# Patient Record
Sex: Female | Born: 1950 | Race: White | Hispanic: No | Marital: Married | Smoking: Never smoker
Health system: Southern US, Community
[De-identification: ages and names within clinical notes are randomized; demographics above are authoritative.]

## PROBLEM LIST (undated history)

## (undated) DIAGNOSIS — E785 Hyperlipidemia, unspecified: Secondary | ICD-10-CM

## (undated) DIAGNOSIS — K219 Gastro-esophageal reflux disease without esophagitis: Secondary | ICD-10-CM

## (undated) DIAGNOSIS — R112 Nausea with vomiting, unspecified: Secondary | ICD-10-CM

## (undated) DIAGNOSIS — Z9889 Other specified postprocedural states: Secondary | ICD-10-CM

## (undated) DIAGNOSIS — L121 Cicatricial pemphigoid: Secondary | ICD-10-CM

## (undated) HISTORY — DX: Cicatricial pemphigoid: L12.1

## (undated) HISTORY — PX: CHOLECYSTECTOMY: SHX55

## (undated) HISTORY — PX: APPENDECTOMY: SHX54

## (undated) HISTORY — DX: Gastro-esophageal reflux disease without esophagitis: K21.9

## (undated) HISTORY — DX: Hyperlipidemia, unspecified: E78.5

## (undated) HISTORY — PX: ABDOMINAL HYSTERECTOMY: SHX81

---

## 1998-04-10 ENCOUNTER — Other Ambulatory Visit: Admission: RE | Admit: 1998-04-10 | Discharge: 1998-04-10 | Payer: Self-pay | Admitting: Gynecology

## 1998-11-14 ENCOUNTER — Inpatient Hospital Stay (HOSPITAL_COMMUNITY): Admission: RE | Admit: 1998-11-14 | Discharge: 1998-11-16 | Payer: Self-pay | Admitting: Gynecology

## 1999-01-29 ENCOUNTER — Other Ambulatory Visit: Admission: RE | Admit: 1999-01-29 | Discharge: 1999-01-29 | Payer: Self-pay | Admitting: Gynecology

## 1999-06-01 ENCOUNTER — Encounter: Payer: Self-pay | Admitting: Gynecology

## 1999-06-01 ENCOUNTER — Encounter: Admission: RE | Admit: 1999-06-01 | Discharge: 1999-06-01 | Payer: Self-pay | Admitting: Gynecology

## 2000-02-27 ENCOUNTER — Other Ambulatory Visit: Admission: RE | Admit: 2000-02-27 | Discharge: 2000-02-27 | Payer: Self-pay | Admitting: Gynecology

## 2000-09-18 ENCOUNTER — Encounter: Admission: RE | Admit: 2000-09-18 | Discharge: 2000-09-18 | Payer: Self-pay | Admitting: Gynecology

## 2000-09-18 ENCOUNTER — Encounter: Payer: Self-pay | Admitting: Gynecology

## 2000-12-08 ENCOUNTER — Other Ambulatory Visit: Admission: RE | Admit: 2000-12-08 | Discharge: 2000-12-08 | Payer: Self-pay | Admitting: Dermatology

## 2001-07-09 ENCOUNTER — Other Ambulatory Visit: Admission: RE | Admit: 2001-07-09 | Discharge: 2001-07-09 | Payer: Self-pay | Admitting: Gynecology

## 2001-11-19 ENCOUNTER — Encounter: Admission: RE | Admit: 2001-11-19 | Discharge: 2001-11-19 | Payer: Self-pay | Admitting: Gynecology

## 2001-11-19 ENCOUNTER — Encounter: Payer: Self-pay | Admitting: Gynecology

## 2002-12-20 ENCOUNTER — Encounter: Admission: RE | Admit: 2002-12-20 | Discharge: 2002-12-20 | Payer: Self-pay | Admitting: Gynecology

## 2002-12-20 ENCOUNTER — Encounter: Payer: Self-pay | Admitting: Gynecology

## 2002-12-29 ENCOUNTER — Other Ambulatory Visit: Admission: RE | Admit: 2002-12-29 | Discharge: 2002-12-29 | Payer: Self-pay | Admitting: Gynecology

## 2004-01-03 ENCOUNTER — Other Ambulatory Visit: Admission: RE | Admit: 2004-01-03 | Discharge: 2004-01-03 | Payer: Self-pay | Admitting: Gynecology

## 2004-01-04 ENCOUNTER — Encounter: Admission: RE | Admit: 2004-01-04 | Discharge: 2004-01-04 | Payer: Self-pay | Admitting: Gynecology

## 2005-04-22 ENCOUNTER — Other Ambulatory Visit: Admission: RE | Admit: 2005-04-22 | Discharge: 2005-04-22 | Payer: Self-pay | Admitting: Gynecology

## 2005-04-25 ENCOUNTER — Encounter: Admission: RE | Admit: 2005-04-25 | Discharge: 2005-04-25 | Payer: Self-pay | Admitting: Gynecology

## 2006-07-07 ENCOUNTER — Ambulatory Visit: Payer: Self-pay | Admitting: Internal Medicine

## 2006-07-09 ENCOUNTER — Ambulatory Visit (HOSPITAL_COMMUNITY): Admission: RE | Admit: 2006-07-09 | Discharge: 2006-07-09 | Payer: Self-pay | Admitting: Internal Medicine

## 2006-07-09 ENCOUNTER — Ambulatory Visit: Payer: Self-pay | Admitting: Cardiology

## 2006-07-28 ENCOUNTER — Other Ambulatory Visit: Admission: RE | Admit: 2006-07-28 | Discharge: 2006-07-28 | Payer: Self-pay | Admitting: Gynecology

## 2006-08-05 ENCOUNTER — Encounter: Admission: RE | Admit: 2006-08-05 | Discharge: 2006-08-05 | Payer: Self-pay | Admitting: Gynecology

## 2006-08-22 ENCOUNTER — Ambulatory Visit: Payer: Self-pay | Admitting: Internal Medicine

## 2007-08-07 ENCOUNTER — Encounter: Admission: RE | Admit: 2007-08-07 | Discharge: 2007-08-07 | Payer: Self-pay | Admitting: Gynecology

## 2009-02-08 ENCOUNTER — Encounter: Admission: RE | Admit: 2009-02-08 | Discharge: 2009-02-08 | Payer: Self-pay | Admitting: Gynecology

## 2009-06-28 ENCOUNTER — Encounter: Admission: RE | Admit: 2009-06-28 | Discharge: 2009-06-28 | Payer: Self-pay | Admitting: Gynecology

## 2010-05-16 ENCOUNTER — Encounter: Admission: RE | Admit: 2010-05-16 | Discharge: 2010-05-16 | Payer: Self-pay | Admitting: Gynecology

## 2010-05-29 ENCOUNTER — Encounter: Admission: RE | Admit: 2010-05-29 | Discharge: 2010-05-29 | Payer: Self-pay | Admitting: Gynecology

## 2010-11-23 NOTE — Procedures (Signed)
NAMERAYNETTE, ARRAS NO.:  [id number]   MEDICAL RECORD NO.:  [id number]          PATIENT TYPE:  OUT   LOCATION:  RAD                           FACILITY:  [facility]   PHYSICIAN:  Gerrit Friends. Dietrich Pates, MD, FACCDATE OF BIRTH:  Feb 21, 1951   DATE OF PROCEDURE:  07/09/2006  DATE OF DISCHARGE:                                ECHOCARDIOGRAM   REFERRING PHYSICIAN:  Dr. Jodelle Green and Dr. Tenny Craw   CLINICAL DATA:  60 year old woman with palpitations.  Aorta 3.1, left  atrium 3.5, septum 1.2, posterior wall 0.9, LV diastole 3.5, LV systole  2.7.   1. Technically adequate echocardiographic study.  2. Normal left atrium, right atrium and right ventricle.  3. Normal trileaflet aortic valve; normal proximal ascending aorta.  4. Normal tricuspid and pulmonic valve; normal proximal pulmonary      artery.  5. Mild mitral valve thickening; trivial mitral regurgitation.  6. Normal internal dimension, wall thickness, regional and global      function of the left ventricle.  7. Normal IVC.      Gerrit Friends. Dietrich Pates, MD, [facility]  Electronically Signed     RMR/MEDQ  D:  07/09/2006  T:  07/09/2006  Job:  [id number]

## 2010-11-23 NOTE — Assessment & Plan Note (Signed)
[place] [facility]                       [place] CARDIOLOGY OFFICE NOTE   Sarissa, Dern MCKYNLIE VANDERSLICE                   MRN:          [id number]  DATE:07/07/2006                            DOB:          1950/10/24    IDENTIFICATION:  Ms. Peggy Hines is a 60 year old woman who is referred for  palpitations.  The patient notes that beginning in June, she began  having episodes of palpitation, initially short-lived but recently  coming on more frequently, lasting several minutes to a half hour at a  time.  She says she feels them more at night when she is lying down.  No  dizziness.  She cut back on caffeine but really noticed no change.   She had a couple of episodes right before Christmas, one in [employer]  when she was walking quickly, one at school where she had some  indigestion-like feeling, eased off when she sat down.  She has not had  any since, even though she has pushed herself to more higher exertion.   ALLERGIES:  None.   MEDICATIONS:  1. Estrogen patch.  2. Calcium with D.  3. Multivitamin.  4. Vitamin E 400.  5. Aspirin 325 daily.  6. Vitamin B-12.   PAST MEDICAL HISTORY:  Negative.   PAST SURGICAL HISTORY:  Status post hysterectomy, status post  appendectomy, status post cholecystectomy.   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   FAMILY HISTORY:  Father had a heart attack at age 65, died at 1.  Mother had CVAs, felt secondary to atrial fibrillation and died at age  19.  Atrial fibrillation began at age 1.  The patient has 3 brothers  who are alive, 1 with atrial fibrillation, 2 sisters who are alive, 1  with atrial fibrillation.   REVIEW OF SYSTEMS:  All systems reviewed, negative except as noted.   Note, TSH was checked and it was normal at 1.1 in June.  Total  cholesterol 215, triglycerides 145, HDL of 47, LDL of 139.  The patient  eats an egg for breakfast.  Dinner will be  all over the place with  foods.   PHYSICAL EXAM:  The patient is in no distress.  Blood pressure 140/90, right arm; 130/88, left arm.  Pulse 84 and  regular.  Weight 166.  NECK:  JVP is normal.  No thyromegaly.  No bruits.  LUNGS:  Clear.  CARDIAC:  Regular rate and rhythm.  S1, S2.  No S3, S4, or murmurs.  ABDOMEN:  Benign.  EXTREMITIES:  Good distal pulses throughout. No lower extremity edema.   A 12-lead EKG, sinus rhythm, 84 beats per minute, poor R-wave  progression in mid anterior leads.   IMPRESSION:  Ms. Claytor is a 60 year old woman with a history of  palpitations for the past 6 months. I would like to set her up for an  event monitor to document what they are.  She does have a significant  family history for atrial fibrillation.  Also, this would interesting if  she had this.   For now, I would keep her  on an aspirin 325.   I would also set her up for an echocardiogram, especially with her EKG  findings that may be lead placement.   I have discussed her lipids with her.  I think she has room to improve.  I would like to get her LDL towards 100.  I would try dietary, exercise,  and weight loss for this.  Followup in 9 months' time.   I set up to see the patient back in 1 month's time.     Pricilla Riffle, MD, [facility]  Electronically Signed    PVR/MedQ  DD: 07/07/2006  DT: 07/07/2006  Job #: [id number]   cc:   Patrica Duel, M.D.

## 2010-11-23 NOTE — Assessment & Plan Note (Signed)
[facility]                            CARDIOLOGY OFFICE NOTE   Peggy Hines, Peggy Hines Peggy Hines                   MRN:          [id number]  DATE:08/22/2006                            DOB:          [birth year]    IDENTIFICATION:  Ms. Peggy Hines is a 60 year old woman who I saw back on  December 31 for evaluation of palpitations.  I set her up for a Holter  monitor and echo.  The patient had the echo done on January 2.  This  showed minimal LVH of the septum at 12 mm, otherwise, normal.  There was  no significant valvular abnormalities.  LV systolic function was normal.  The patient also wore an event monitor for a month.  She had episodes of  palpitations.  There was only one spell on January 13 where she had a  short burst of PAT for 13 beats that could consider atrial fibrillation,  but again, less than 10 seconds.  Otherwise, sinus rhythm, sinus tach.   On talking to the patient, she actually is doing some better, her stress  load has gone down.  She is not in any discomfort in her chest.  She has  cut back on caffeine.   CURRENT MEDICATIONS:  Estrogen patch, calcium with D, multi-vitamin,  aspirin 325 (taken since 2000).   PHYSICAL EXAMINATION:  GENERAL:  The patient is in no distress.  VITAL SIGNS:  Blood pressure 132/80, pulse 76 and regular, weight 169.  LUNGS:  Clear.  CARDIAC:  Regular rate and rhythm, S1 and S2, no S3, no murmurs.  EXTREMITIES:  No edema.   IMPRESSION:  1. Palpitations, strong family history for atrial fibrillation, short      burst of PAT, she may have other spells but I think with minimal      symptoms, I would keep her on an aspirin, she has tolerated it for      the past seven years.  Follow up in about 14 months.  2. Health care maintenance.  When I saw her last, her LDL was noted to      be 139 back in June, HDL 47, and again, I talked to her about diet      and increasing her exercise.  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  I will follow up again in a little over a years      time.   If her symptoms worsen, of course, I would be happy to see her sooner.     Pricilla Riffle, MD, [facility]  Electronically Signed    PVR/MedQ  DD: 08/22/2006  DT: 08/22/2006  Job #: [id number]   cc:   Peggy Hines, M.D.

## 2011-08-06 ENCOUNTER — Other Ambulatory Visit: Payer: Self-pay | Admitting: Gynecology

## 2011-08-06 DIAGNOSIS — Z1231 Encounter for screening mammogram for malignant neoplasm of breast: Secondary | ICD-10-CM

## 2011-08-14 ENCOUNTER — Ambulatory Visit
Admission: RE | Admit: 2011-08-14 | Discharge: 2011-08-14 | Disposition: A | Discharge status: Home / Self Care | Payer: BC Managed Care – PPO | Source: Ambulatory Visit | Attending: Gynecology | Admitting: Gynecology

## 2011-08-14 ENCOUNTER — Ambulatory Visit: Payer: Self-pay

## 2011-08-14 DIAGNOSIS — Z1231 Encounter for screening mammogram for malignant neoplasm of breast: Secondary | ICD-10-CM

## 2011-08-21 ENCOUNTER — Other Ambulatory Visit: Payer: Self-pay | Admitting: Gynecology

## 2011-08-21 DIAGNOSIS — R928 Other abnormal and inconclusive findings on diagnostic imaging of breast: Secondary | ICD-10-CM

## 2011-09-04 ENCOUNTER — Ambulatory Visit
Admission: RE | Admit: 2011-09-04 | Discharge: 2011-09-04 | Disposition: A | Discharge status: Home / Self Care | Payer: BC Managed Care – PPO | Source: Ambulatory Visit | Attending: Gynecology | Admitting: Gynecology

## 2011-09-04 DIAGNOSIS — R928 Other abnormal and inconclusive findings on diagnostic imaging of breast: Secondary | ICD-10-CM

## 2012-08-25 ENCOUNTER — Other Ambulatory Visit: Payer: Self-pay | Admitting: Gynecology

## 2012-08-25 DIAGNOSIS — Z1231 Encounter for screening mammogram for malignant neoplasm of breast: Secondary | ICD-10-CM

## 2012-09-10 ENCOUNTER — Ambulatory Visit
Admission: RE | Admit: 2012-09-10 | Discharge: 2012-09-10 | Disposition: A | Discharge status: Home / Self Care | Payer: BC Managed Care – PPO | Source: Ambulatory Visit | Attending: Gynecology | Admitting: Gynecology

## 2012-09-10 ENCOUNTER — Ambulatory Visit: Payer: BC Managed Care – PPO

## 2012-09-10 DIAGNOSIS — Z1231 Encounter for screening mammogram for malignant neoplasm of breast: Secondary | ICD-10-CM

## 2013-09-01 ENCOUNTER — Other Ambulatory Visit: Payer: Self-pay

## 2013-09-01 DIAGNOSIS — Z1231 Encounter for screening mammogram for malignant neoplasm of breast: Secondary | ICD-10-CM

## 2013-09-14 ENCOUNTER — Ambulatory Visit
Admission: RE | Admit: 2013-09-14 | Discharge: 2013-09-14 | Disposition: A | Discharge status: Home / Self Care | Payer: BC Managed Care – PPO | Source: Ambulatory Visit

## 2013-09-14 DIAGNOSIS — Z1231 Encounter for screening mammogram for malignant neoplasm of breast: Secondary | ICD-10-CM

## 2013-09-16 ENCOUNTER — Other Ambulatory Visit: Payer: Self-pay | Admitting: Obstetrics and Gynecology

## 2013-09-16 DIAGNOSIS — R928 Other abnormal and inconclusive findings on diagnostic imaging of breast: Secondary | ICD-10-CM

## 2013-10-05 ENCOUNTER — Ambulatory Visit
Admission: RE | Admit: 2013-10-05 | Discharge: 2013-10-05 | Disposition: A | Discharge status: Home / Self Care | Payer: BC Managed Care – PPO | Source: Ambulatory Visit | Attending: Obstetrics and Gynecology | Admitting: Obstetrics and Gynecology

## 2013-10-05 DIAGNOSIS — R928 Other abnormal and inconclusive findings on diagnostic imaging of breast: Secondary | ICD-10-CM

## 2014-10-06 ENCOUNTER — Other Ambulatory Visit: Payer: Self-pay

## 2014-10-06 DIAGNOSIS — Z1231 Encounter for screening mammogram for malignant neoplasm of breast: Secondary | ICD-10-CM

## 2014-10-07 ENCOUNTER — Ambulatory Visit
Admission: RE | Admit: 2014-10-07 | Discharge: 2014-10-07 | Disposition: A | Discharge status: Home / Self Care | Payer: BC Managed Care – PPO | Source: Ambulatory Visit

## 2014-10-07 DIAGNOSIS — Z1231 Encounter for screening mammogram for malignant neoplasm of breast: Secondary | ICD-10-CM

## 2015-07-09 HISTORY — PX: SPINAL FUSION: SHX223

## 2015-09-13 ENCOUNTER — Other Ambulatory Visit: Payer: Self-pay

## 2015-09-13 DIAGNOSIS — Z1231 Encounter for screening mammogram for malignant neoplasm of breast: Secondary | ICD-10-CM

## 2015-10-12 ENCOUNTER — Ambulatory Visit
Admission: RE | Admit: 2015-10-12 | Discharge: 2015-10-12 | Disposition: A | Discharge status: Home / Self Care | Payer: Medicare Other | Source: Ambulatory Visit

## 2015-10-12 DIAGNOSIS — Z1231 Encounter for screening mammogram for malignant neoplasm of breast: Secondary | ICD-10-CM

## 2015-10-24 ENCOUNTER — Encounter: Payer: Self-pay | Admitting: Family Medicine

## 2015-10-24 ENCOUNTER — Other Ambulatory Visit: Payer: Self-pay | Admitting: Family Medicine

## 2015-10-24 ENCOUNTER — Ambulatory Visit (INDEPENDENT_AMBULATORY_CARE_PROVIDER_SITE_OTHER): Payer: Medicare Other | Admitting: Family Medicine

## 2015-10-24 ENCOUNTER — Ambulatory Visit
Admission: RE | Admit: 2015-10-24 | Discharge: 2015-10-24 | Disposition: A | Discharge status: Home / Self Care | Payer: Medicare Other | Source: Ambulatory Visit | Attending: Family Medicine | Admitting: Family Medicine

## 2015-10-24 VITALS — BP 146/84 | Ht 68.0 in | Wt 165.0 lb

## 2015-10-24 DIAGNOSIS — M542 Cervicalgia: Secondary | ICD-10-CM

## 2015-10-24 DIAGNOSIS — M79602 Pain in left arm: Secondary | ICD-10-CM | POA: Diagnosis not present

## 2015-10-24 MED ORDER — MELOXICAM 7.5 MG PO TABS: ORAL_TABLET | ORAL | Status: DC

## 2015-10-24 NOTE — Progress Notes (Signed)
Peggy Hines - 65 y.o. female MRN [id number]  Date of birth: 10/30/1950  CC: Left sided neck and arm pain  SUBJECTIVE:   HPI Peggy Hines is a very pleasant 65 year old female presenting with left-sided neck and arm discomfort. This pain began in early March when she thought she pulled a muscle in her mid trap on the left.  She initially treated herself with Aleve and heating pad but the pain worsened. She saw her PCP who prescribed prednisone and I am steroid shot. She is unsure if this really helped although over the next few weeks her pain is continuing to progress. She is seeing a chiropractor 7 times with no improvement. She then returned her PCP 5 days ago on 10/19/2015 and was prescribed gabapentin 100 mg daily at bedtime as well as what sounds like a Toradol and muscle relaxer shot. The gabapentin is providing some relief. Over the last few weeks her arm pain has worsened and her neck pain has failed to improve. She does not have any history of neck pain in the past. She states that she has intermittent numbness in her shoulder or elbow and most prominently on the first and second digit of her left hand. She does feel shooting pain from her neck down her arm. She denies any weakness. She does have numbness. She has no loss of dexterity and she has not been dropping items. She states that she has full range of motion of her entire left upper extremity. She has currently minimal neck pain with full range of motion. She denies any bowel or bladder dysfunction or lower extremity symptoms. She has not been taking any NSAIDs in the last few weeks.  ROS:     14 point review of systems negative other than that listed above in regards to musculoskeletal issue  HISTORY: Past Medical, Surgical, Social, and Family History Reviewed & Updated per EMR.  Pertinent Historical Findings include: Hyperlipidemia, otherwise not on any chronic medications.  OBJECTIVE: BP 146/84 mmHg  Ht 5\' 8"  (1.727 m)  Wt  165 lb (74.844 kg)  BMI 25.09 kg/m2  Physical Exam Calm, no acute distress Nonlabored breathing  Neck: Positive spurling's on the left. Negative Hoffmann's reflex. Full neck range of motion without discomfort Grip strength and sensation normal in bilateral hands Strength good C4 to T1 distribution other than 4+ out of 5 elbow extension No sensory change to C4 to T1 Reflexes normal  MEDICATIONS, LABS & OTHER ORDERS: Previous Medications   ESTRADIOL (VIVELLE-DOT) 0.05 MG/24HR PATCH    APPLY 1 PATCH TWICE A WEEK   FUROSEMIDE (LASIX) 20 MG TABLET       GABAPENTIN (NEURONTIN) 100 MG CAPSULE       SIMVASTATIN (ZOCOR) 20 MG TABLET       Modified Medications   No medications on file   New Prescriptions   MELOXICAM (MOBIC) 7.5 MG TABLET    Take one tablet a day for 10 days, then take as needed for pain.   Discontinued Medications   No medications on file   Orders Placed This Encounter  Procedures  . MR Cervical Spine Wo Contrast   ASSESSMENT & PLAN: Cervical radiculopathy, left-sided: Peggy Hines has been dealing with this discomfort for the last month. It is progressive in nature and appears to be leading to some upper extremity weakness. I think we need to get a 4 view cervical spine x-ray (along with flexion/extension views) and subsequently an MRI to evaluate for an acute disc rupture. In  the interim she will gradually increase her gabapentin dose, no more than one additional 100 mg tab every 3 days. Additionally she will start taking Mobic 7.5 mg daily for the next 10 days and then as needed. We will call her with her x-ray and MRI results. Should she have any progressive symptoms she needs to seek immediate medical attention. Call with any questions in the interim.

## 2015-10-31 ENCOUNTER — Ambulatory Visit
Admission: RE | Admit: 2015-10-31 | Discharge: 2015-10-31 | Disposition: A | Discharge status: Home / Self Care | Payer: Medicare Other | Source: Ambulatory Visit | Attending: Family Medicine | Admitting: Family Medicine

## 2015-10-31 DIAGNOSIS — M542 Cervicalgia: Secondary | ICD-10-CM

## 2015-11-01 NOTE — Addendum Note (Signed)
Addended by: Cyd Silence on: 11/01/2015 04:16 PM   Modules accepted: Orders

## 2015-12-28 ENCOUNTER — Other Ambulatory Visit (HOSPITAL_COMMUNITY): Payer: Self-pay | Admitting: Family Medicine

## 2015-12-29 ENCOUNTER — Other Ambulatory Visit: Payer: Self-pay | Admitting: Family Medicine

## 2015-12-29 DIAGNOSIS — R5381 Other malaise: Secondary | ICD-10-CM

## 2016-07-23 ENCOUNTER — Ambulatory Visit (INDEPENDENT_AMBULATORY_CARE_PROVIDER_SITE_OTHER): Payer: Medicare Other | Admitting: Sports Medicine

## 2016-07-23 ENCOUNTER — Encounter: Payer: Self-pay | Admitting: Sports Medicine

## 2016-07-23 VITALS — BP 160/92 | Ht 68.0 in | Wt 180.0 lb

## 2016-07-23 DIAGNOSIS — M67911 Unspecified disorder of synovium and tendon, right shoulder: Secondary | ICD-10-CM | POA: Diagnosis not present

## 2016-07-23 MED ORDER — METHYLPREDNISOLONE ACETATE 40 MG/ML IJ SUSP
40.0000 mg | Freq: Once | INTRAMUSCULAR | Status: AC
  Administered 2016-07-23: 40 mg via INTRA_ARTICULAR

## 2016-07-23 NOTE — Progress Notes (Signed)
Subjective:  Peggy Hines is a 66 y.o. female who presents to the [facility] today with a chief complaint of right shoulder pain.   HPI:  Right Shoulder Pain Patient with a history of of cervical radiculopathy causing left sided shoulder and arm pain. She underwent surgery in May which helped significantly with her symptoms. Starting 4 months ago, patient started having pain in her symptoms in her right arm and shoulder after moving some furniture around her house. Pain is located "all over" her shoulder though seems to be more focused in the posterior aspect. Pain is better when lying directly on the arm. No other obvious precipitating events. She has not tried any medications. No numbness. She does feel like her right arm is a little weaker than her left arm and she has some difficulty putting her right arm behind her back.   ROS: Per HPI  PMH: Smoking history reviewed.    Objective:  Physical Exam: BP (!) 160/92   Ht 5\' 8"  (1.727 m)   Wt 180 lb (81.6 kg)   BMI 27.37 kg/m   Gen: NAD, resting comfortably CV: RRR with no murmurs appreciated MSK: - R Shoulder: No deformities. Tender to palpation over AC joint and supraspinatous. Full flexion and abduction without pain. Patient unable to place right arm behind back. Strength: 4+/5 with internal rotation, otherwise 5/5 in all major fields. Neer test negative, Hawken positive. Positive empty can sign. Spurling negative. Neurovascularly intact. - L Shoulder: No deformities. Nontender to palpation. FROM. Strength 5/5 throughout. Neurovascularly intact.  Bedside US R Shoulder: Supraspinatus with irregularities noted without obvious tear. Mild degenerative changes in [facility] joint with positive mushroom sign.  Shoulder Injection Procedure Note  Pre-operative Diagnosis: right rotator cuff tendinopathy  Post-operative Diagnosis: same  Anesthesia: Topical ethyl chloride  Procedure Details   Verbal consent was obtained for the procedure.  The shoulder was prepped with iodine and the skin was anesthetized. Using a 22 gauge needle thesubacromial space was injected with 3 mL 1% lidocaine and 1 mL of 40mg /ml depo-medrol under the posterior aspect of the acromion. The injection site was cleansed with topical isopropyl alcohol and a dressing was applied.  Complications:  None; patient tolerated the procedure well.   Assessment/Plan:  Right Shoulder Pain Pain likely secondary to rotator cuff tendinopathy. Patient is also very tender at her [facility] joint with mild degenerative changes which may be contributing but is not likely her primary problem. Subacromial steroid injection performed today (see above procedure note). Will follow up in 3 weeks. If improving, will start rotator cuff exercise program. If not improving, consider MRI to further characterize rotator cuff.   Algis Greenhouse. Jerline Pain, [facility] Resident PGY-3 07/23/2016 10:27 AM   Patient seen and evaluated with the resident. I agree with the above plan of care. Patient has clinical exam findings consistent with rotator cuff tendinopathy as well as acromioclavicular joint arthropathy. Ultrasound shows evidence of tendinopathy in the supraspinatus but I do not appreciate a full-thickness tear. Her acromioclavicular joint shows a positive mushroom sign which is consistent with subacromial pathology but it does not appear to have a significant amount of spurring. For diagnostic as well as therapeutic reasons we have elected to inject her right subacromial space with cortisone. Patient will follow-up with me in 3 weeks for reevaluation. We could consider an acromioclavicular joint injection if pain persists in this area. However, if she notes no improvement at all with today's injection then I would first  consider further diagnostic imaging in the form of x-rays and an MRI prior to delineating further treatment. I've also recommended that she try 15 mg of meloxicam prior to  bedtime to see if this will help with nighttime pain. She has a limited supply of medicine at home and I would be happy to refill this for her if she finds this to be helpful.

## 2016-07-24 ENCOUNTER — Telehealth: Payer: Medicare Other | Admitting: Nurse Practitioner

## 2016-07-24 DIAGNOSIS — J111 Influenza due to unidentified influenza virus with other respiratory manifestations: Secondary | ICD-10-CM

## 2016-07-24 MED ORDER — OSELTAMIVIR PHOSPHATE 75 MG PO CAPS: 75.0000 mg | ORAL_CAPSULE | Freq: Two times a day (BID) | ORAL | 0 refills | Status: DC

## 2016-07-24 NOTE — Progress Notes (Signed)

## 2016-07-29 ENCOUNTER — Ambulatory Visit (INDEPENDENT_AMBULATORY_CARE_PROVIDER_SITE_OTHER): Payer: Self-pay | Admitting: Nurse Practitioner

## 2016-07-29 VITALS — BP 130/84 | HR 87 | Temp 98.0°F | Wt 177.6 lb

## 2016-07-29 DIAGNOSIS — J209 Acute bronchitis, unspecified: Secondary | ICD-10-CM

## 2016-07-29 DIAGNOSIS — J111 Influenza due to unidentified influenza virus with other respiratory manifestations: Secondary | ICD-10-CM

## 2016-07-29 MED ORDER — BENZONATATE 100 MG PO CAPS: 100.0000 mg | ORAL_CAPSULE | Freq: Two times a day (BID) | ORAL | 0 refills | Status: AC | PRN

## 2016-07-29 MED ORDER — MAGIC MOUTHWASH W/LIDOCAINE: 5.0000 mL | Freq: Three times a day (TID) | ORAL | 0 refills | Status: AC | PRN

## 2016-07-29 MED ORDER — ALBUTEROL SULFATE HFA 108 (90 BASE) MCG/ACT IN AERS: 2.0000 | INHALATION_SPRAY | Freq: Four times a day (QID) | RESPIRATORY_TRACT | 0 refills | Status: DC | PRN

## 2016-07-29 NOTE — Progress Notes (Signed)
   Subjective:    Patient ID: Peggy Hines, female    DOB: 11-16-1950, 66 y.o.   MRN: [id number]  The patient is a 66 y.o. Female that presents with complaints of sore throat, coughing and periods of difficulty catching her breath.  The patient was diagnosed with influenza on 07/24/16 via e-visit.  Her symptoms started with sore throat then sudden onset of achiness, fever, and chills.  Patient was given Tamiflu, which she developed nausea and vomiting after 2 doses.  Nausea and vomiting resolved within 24hrs after stopping Tamiflu. The patient states on 1/21 she developed coughing and noticed that she was using purse lip breathing.  The patient has had fever on Saturday for which she treated with Tylenol only 1-2 times.  Patient has a history of hyperlipidemia, and takes fish oil and estrogen.  Patient denies history of smoking.  Patient states she was told influenza lasts only 5 days and is wondering why she is still not completely over this.      Review of Systems  Constitutional: Positive for activity change, appetite change, chills, fatigue and fever.  HENT: Positive for congestion, postnasal drip and sore throat. Negative for ear discharge, ear pain, sinus pain and sinus pressure.   Eyes: Negative.   Respiratory: Positive for shortness of breath.   Cardiovascular: Negative.   Gastrointestinal: Positive for nausea and vomiting.  Musculoskeletal:       Achiness  Skin: Negative.   Allergic/Immunologic: Negative.   Neurological: Positive for headaches.  Psychiatric/Behavioral: Negative.        Objective:   Physical Exam  Constitutional: She is oriented to person, place, and time. She appears well-developed and well-nourished. No distress.  HENT:  Head: Normocephalic and atraumatic.  Eyes: Conjunctivae and EOM are normal. Pupils are equal, round, and reactive to light.  No maxillary or frontal sinus pressure/tenderness. Turbinates erythematous with swelling.  Tonsils mildly  erythematous, mild edema, no exudate.  Neck: Normal range of motion. Neck supple.  Cardiovascular: Normal rate, regular rhythm and normal heart sounds.   Pulmonary/Chest: Effort normal. No respiratory distress. She has wheezes (LLL). She has no rales.  Abdominal: Soft. Bowel sounds are normal.  Lymphadenopathy:    She has no cervical adenopathy.  Neurological: She is alert and oriented to person, place, and time. No cranial nerve deficit. Coordination normal.  Skin: Skin is warm and dry.  Psychiatric: She has a normal mood and affect. Her behavior is normal. Judgment and thought content normal.  Vitals reviewed.         Assessment & Plan:  Acute Bronchitis and Influenza.  Patient given patient education.  Patient given prescriptions and will use as directed.  Patient to increase fluids, rest and continue symptomatic treatment.  Patient instructed to go to ER if increased difficulty breathing, fever >101.4, increased HR, or other concerns.  Patient verbalized understanding.

## 2016-07-29 NOTE — Patient Instructions (Addendum)
Acute Bronchitis, Adult Acute bronchitis is sudden (acute) swelling of the air tubes (bronchi) in the lungs. Acute bronchitis causes these tubes to fill with mucus, which can make it hard to breathe. It can also cause coughing or wheezing. In adults, acute bronchitis usually goes away within 2 weeks. A cough caused by bronchitis may last up to 3 weeks. Smoking, allergies, and asthma can make the condition worse. Repeated episodes of bronchitis may cause further lung problems, such as chronic obstructive pulmonary disease (COPD). What are the causes? This condition can be caused by germs and by substances that irritate the lungs, including:  Cold and flu viruses. This condition is most often caused by the same virus that causes a cold.  Bacteria.  Exposure to tobacco smoke, dust, fumes, and air pollution. What increases the risk? This condition is more likely to develop in people who:  Have close contact with someone with acute bronchitis.  Are exposed to lung irritants, such as tobacco smoke, dust, fumes, and vapors.  Have a weak immune system.  Have a respiratory condition such as asthma. What are the signs or symptoms? Symptoms of this condition include:  A cough.  Coughing up clear, yellow, or green mucus.  Wheezing.  Chest congestion.  Shortness of breath.  A fever.  Body aches.  Chills.  A sore throat. How is this diagnosed? This condition is usually diagnosed with a physical exam. During the exam, your health care provider may order tests, such as chest X-rays, to rule out other conditions. He or she may also:  Test a sample of your mucus for bacterial infection.  Check the level of oxygen in your blood. This is done to check for pneumonia.  Do a chest X-ray or lung function testing to rule out pneumonia and other conditions.  Perform blood tests. Your health care provider will also ask about your symptoms and medical history. How is this treated? Most cases  of acute bronchitis clear up over time without treatment. Your health care provider may recommend:  Drinking more fluids. Drinking more makes your mucus thinner, which may make it easier to breathe.  Taking a medicine for a fever or cough.  Taking an antibiotic medicine.  Using an inhaler to help improve shortness of breath and to control a cough.  Using a cool mist vaporizer or humidifier to make it easier to breathe. Follow these instructions at home: Medicines  Take over-the-counter and prescription medicines only as told by your health care provider.  If you were prescribed an antibiotic, take it as told by your health care provider. Do not stop taking the antibiotic even if you start to feel better.  Use honey with lemon for throat pain/discomfort.  Take Ibuprofen 800 mg three times daily for pain/fever. General instructions  Get plenty of rest.  Drink enough fluids to keep your urine clear or pale yellow.  Avoid smoking and secondhand smoke. Exposure to cigarette smoke or irritating chemicals will make bronchitis worse. If you smoke and you need help quitting, ask your health care provider. Quitting smoking will help your lungs heal faster.  Use an inhaler, cool mist vaporizer, or humidifier as told by your health care provider.  Keep all follow-up visits as told by your health care provider. This is important. How is this prevented? To lower your risk of getting this condition again:  Wash your hands often with soap and water. If soap and water are not available, use hand sanitizer.  Avoid contact with people  who have cold symptoms.  Try not to touch your hands to your mouth, nose, or eyes.  Make sure to get the flu shot every year. Contact a health care provider if:  Your symptoms do not improve in 2 weeks of treatment. Get help right away if:  You cough up blood.  You have chest pain.  You have severe shortness of breath.  You become dehydrated.  You  faint or keep feeling like you are going to faint.  You keep vomiting.  You have a severe headache.  Your fever or chills gets worse. This information is not intended to replace advice given to you by your health care provider. Make sure you discuss any questions you have with your health care provider. Document Released: 08/01/2004 Document Revised: 01/17/2016 Document Reviewed: 12/13/2015 Elsevier Interactive Patient Education  2017 [employer].  Influenza, Adult Influenza, more commonly known as "the flu," is a viral infection that primarily affects the respiratory tract. The respiratory tract includes organs that help you breathe, such as the lungs, nose, and throat. The flu causes many common cold symptoms, as well as a high fever and body aches. The flu spreads easily from person to person (is contagious). Getting a flu shot (influenza vaccination) every year is the best way to prevent influenza. What are the causes? Influenza is caused by a virus. You can catch the virus by:  Breathing in droplets from an infected person's cough or sneeze.  Touching something that was recently contaminated with the virus and then touching your mouth, nose, or eyes. What increases the risk? The following factors may make you more likely to get the flu:  Not cleaning your hands frequently with soap and water or alcohol-based hand sanitizer.  Having close contact with many people during cold and flu season.  Touching your mouth, eyes, or nose without washing or sanitizing your hands first.  Not drinking enough fluids or not eating a healthy diet.  Not getting enough sleep or exercise.  Being under a high amount of stress.  Not getting a yearly (annual) flu shot. You may be at a higher risk of complications from the flu, such as a severe lung infection (pneumonia), if you:  Are over the age of 59.  Are pregnant.  Have a weakened disease-fighting system (immune system). You may have a  weakened immune system if you:  Have HIV or AIDS.  Are undergoing chemotherapy.  Aretaking medicines that reduce the activity of (suppress) the immune system.  Have a long-term (chronic) illness, such as heart disease, kidney disease, diabetes, or lung disease.  Have a liver disorder.  Are obese.  Have anemia. What are the signs or symptoms? Symptoms of this condition typically last 4-10 days and may include:  Fever.  Chills.  Headache, body aches, or muscle aches.  Sore throat.  Cough.  Runny or congested nose.  Chest discomfort and cough.  Poor appetite.  Weakness or tiredness (fatigue).  Dizziness.  Nausea or vomiting. How is this diagnosed? This condition may be diagnosed based on your medical history and a physical exam. Your health care provider may do a nose or throat swab test to confirm the diagnosis. How is this treated? If influenza is detected early, you can be treated with antiviral medicine that can reduce the length of your illness and the severity of your symptoms. This medicine may be given by mouth (orally) or through an IV tube that is inserted in one of your veins. The goal of  treatment is to relieve symptoms by taking care of yourself at home. This may include taking over-the-counter medicines, drinking plenty of fluids, and adding humidity to the air in your home. In some cases, influenza goes away on its own. Severe influenza or complications from influenza may be treated in a hospital. Follow these instructions at home:  Take over-the-counter and prescription medicines only as told by your health care provider.  Use a cool mist humidifier to add humidity to the air in your home. This can make breathing easier.  Rest as needed.  Drink enough fluid to keep your urine clear or pale yellow.  Cover your mouth and nose when you cough or sneeze.  Wash your hands with soap and water often, especially after you cough or sneeze. If soap and  water are not available, use hand sanitizer.  Stay home from work or school as told by your health care provider. Unless you are visiting your health care provider, try to avoid leaving home until your fever has been gone for 24 hours without the use of medicine.  Keep all follow-up visits as told by your health care provider. This is important. How is this prevented?  Getting an annual flu shot is the best way to avoid getting the flu. You may get the flu shot in late summer, fall, or winter. Ask your health care provider when you should get your flu shot.  Wash your hands often or use hand sanitizer often.  Avoid contact with people who are sick during cold and flu season.  Eat a healthy diet, drink plenty of fluids, get enough sleep, and exercise regularly. Contact a health care provider if:  You develop new symptoms.  You have:  Chest pain.  Diarrhea.  A fever.  Your cough gets worse.  You produce more mucus.  You feel nauseous or you vomit. Get help right away if:  You develop shortness of breath or difficulty breathing.  Your skin or nails turn a bluish color.  You have severe pain or stiffness in your neck.  You develop a sudden headache or sudden pain in your face or ear.  You cannot stop vomiting. This information is not intended to replace advice given to you by your health care provider. Make sure you discuss any questions you have with your health care provider. Document Released: 06/21/2000 Document Revised: 11/30/2015 Document Reviewed: 04/18/2015 Elsevier Interactive Patient Education  2017 [employer].

## 2016-07-30 ENCOUNTER — Telehealth: Payer: Self-pay | Admitting: Nurse Practitioner

## 2016-07-30 NOTE — Telephone Encounter (Signed)
Called patient to follow up.  Reached vm, left message.

## 2016-08-13 ENCOUNTER — Ambulatory Visit (INDEPENDENT_AMBULATORY_CARE_PROVIDER_SITE_OTHER): Payer: Medicare Other | Admitting: Sports Medicine

## 2016-08-13 ENCOUNTER — Encounter: Payer: Self-pay | Admitting: Sports Medicine

## 2016-08-13 ENCOUNTER — Ambulatory Visit
Admission: RE | Admit: 2016-08-13 | Discharge: 2016-08-13 | Disposition: A | Discharge status: Home / Self Care | Payer: Medicare Other | Source: Ambulatory Visit | Attending: Sports Medicine | Admitting: Sports Medicine

## 2016-08-13 VITALS — BP 149/67 | Ht 68.0 in | Wt 180.0 lb

## 2016-08-13 DIAGNOSIS — M25511 Pain in right shoulder: Secondary | ICD-10-CM

## 2016-08-13 DIAGNOSIS — M67911 Unspecified disorder of synovium and tendon, right shoulder: Secondary | ICD-10-CM | POA: Diagnosis not present

## 2016-08-13 NOTE — Progress Notes (Signed)
   Subjective:    Patient ID: Peggy Hines, female    DOB: 06/18/1951, 66 y.o.   MRN: [id number]  HPI   Patient comes in today for follow-up on right shoulder pain. Pain persists despite a recent subacromial cortisone injection. Diffuse pain throughout the shoulder which is most noticeable with internal rotation. She also gets significant pain at night. No numbness or tingling. Previous [facility] ultrasound did not show any obvious rotator cuff tear.    Review of Systems    as above Objective:   Physical Exam  Well-developed, well-nourished. No acute distress. Awake alert and oriented 3. Vital signs reviewed  Right shoulder: Full range of motion. Slight tenderness over the acromioclavicular joint. Mildly positive empty can but markedly positive Hawkins. Supraspinatus strength is 4+/5 and reproducible of pain. Resisted external rotation is 4/5 with reproducible pain. Good internal rotation against resistance. Neurovascularly intact distally.  X-rays of the right shoulder are unremarkable. No significant degenerative changes are seen at either the acromioclavicular joint or in the glenohumeral joint.      Assessment & Plan:   Persistent right shoulder pain worrisome for rotator cuff tear  Patient has failed to improve with conservative treatment including a subacromial cortisone injection. Although her previous [facility] ultrasound did not show any evidence of a rotator cuff tear, her clinical findings are concerning for an occult tear. Therefore, we need to proceed with an MRI specifically to rule out a significant rotator cuff tear that may need operative intervention. Phone follow-up after that study is reviewed to discuss the results and delineate a more definitive treatment plan. In the meantime, she can continue with her anti-inflammatories as needed for pain.

## 2016-08-20 ENCOUNTER — Ambulatory Visit
Admission: RE | Admit: 2016-08-20 | Discharge: 2016-08-20 | Disposition: A | Discharge status: Home / Self Care | Payer: Medicare Other | Source: Ambulatory Visit | Attending: Sports Medicine | Admitting: Sports Medicine

## 2016-08-20 DIAGNOSIS — M25511 Pain in right shoulder: Secondary | ICD-10-CM

## 2016-08-26 ENCOUNTER — Telehealth: Payer: Self-pay | Admitting: Sports Medicine

## 2016-08-26 NOTE — Telephone Encounter (Signed)
I spoke with Peggy Hines on the phone today after reviewing the MRI of her right shoulder. 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 She continues to have pain despite conservative treatment including a cortisone injection. Therefore, I recommended a referral to Dr. Mardelle Matte to discuss further workup and treatment. Patient will follow-up with me as needed.

## 2016-08-27 NOTE — Telephone Encounter (Signed)
Dr Carter Kitten at [facility] Wednesday 09/04/16 at 3pm [address] [place]. [place]

## 2016-09-19 ENCOUNTER — Ambulatory Visit (INDEPENDENT_AMBULATORY_CARE_PROVIDER_SITE_OTHER): Payer: Medicare Other | Admitting: Otolaryngology

## 2016-09-19 DIAGNOSIS — R070 Pain in throat: Secondary | ICD-10-CM | POA: Diagnosis not present

## 2016-09-19 DIAGNOSIS — K219 Gastro-esophageal reflux disease without esophagitis: Secondary | ICD-10-CM

## 2016-10-31 ENCOUNTER — Ambulatory Visit (INDEPENDENT_AMBULATORY_CARE_PROVIDER_SITE_OTHER): Payer: Medicare Other | Admitting: Otolaryngology

## 2016-10-31 DIAGNOSIS — K219 Gastro-esophageal reflux disease without esophagitis: Secondary | ICD-10-CM | POA: Diagnosis not present

## 2016-10-31 DIAGNOSIS — R070 Pain in throat: Secondary | ICD-10-CM | POA: Diagnosis not present

## 2016-11-25 ENCOUNTER — Other Ambulatory Visit: Payer: Self-pay | Admitting: Gynecology

## 2016-11-25 DIAGNOSIS — Z1231 Encounter for screening mammogram for malignant neoplasm of breast: Secondary | ICD-10-CM

## 2016-12-10 ENCOUNTER — Ambulatory Visit
Admission: RE | Admit: 2016-12-10 | Discharge: 2016-12-10 | Disposition: A | Discharge status: Home / Self Care | Payer: Medicare Other | Source: Ambulatory Visit | Attending: Gynecology | Admitting: Gynecology

## 2016-12-10 DIAGNOSIS — Z1231 Encounter for screening mammogram for malignant neoplasm of breast: Secondary | ICD-10-CM

## 2017-01-23 ENCOUNTER — Ambulatory Visit (INDEPENDENT_AMBULATORY_CARE_PROVIDER_SITE_OTHER): Payer: Medicare Other | Admitting: Otolaryngology

## 2017-01-23 DIAGNOSIS — K219 Gastro-esophageal reflux disease without esophagitis: Secondary | ICD-10-CM

## 2017-01-23 DIAGNOSIS — R070 Pain in throat: Secondary | ICD-10-CM

## 2017-07-17 ENCOUNTER — Ambulatory Visit (INDEPENDENT_AMBULATORY_CARE_PROVIDER_SITE_OTHER): Payer: Medicare Other | Admitting: Otolaryngology

## 2017-07-17 DIAGNOSIS — K219 Gastro-esophageal reflux disease without esophagitis: Secondary | ICD-10-CM

## 2017-07-17 DIAGNOSIS — R070 Pain in throat: Secondary | ICD-10-CM | POA: Diagnosis not present

## 2017-07-17 DIAGNOSIS — R221 Localized swelling, mass and lump, neck: Secondary | ICD-10-CM | POA: Diagnosis not present

## 2017-09-18 ENCOUNTER — Ambulatory Visit (INDEPENDENT_AMBULATORY_CARE_PROVIDER_SITE_OTHER): Payer: Medicare Other | Admitting: Otolaryngology

## 2017-09-18 DIAGNOSIS — H6121 Impacted cerumen, right ear: Secondary | ICD-10-CM

## 2017-09-18 DIAGNOSIS — K219 Gastro-esophageal reflux disease without esophagitis: Secondary | ICD-10-CM

## 2017-12-15 ENCOUNTER — Other Ambulatory Visit: Payer: Self-pay | Admitting: Gynecology

## 2017-12-15 DIAGNOSIS — Z1231 Encounter for screening mammogram for malignant neoplasm of breast: Secondary | ICD-10-CM

## 2018-01-05 ENCOUNTER — Ambulatory Visit
Admission: RE | Admit: 2018-01-05 | Discharge: 2018-01-05 | Disposition: A | Discharge status: Home / Self Care | Payer: Medicare Other | Source: Ambulatory Visit | Attending: Gynecology | Admitting: Gynecology

## 2018-01-05 DIAGNOSIS — Z1231 Encounter for screening mammogram for malignant neoplasm of breast: Secondary | ICD-10-CM

## 2018-02-02 ENCOUNTER — Other Ambulatory Visit (HOSPITAL_COMMUNITY): Payer: Self-pay | Admitting: Family Medicine

## 2018-02-02 DIAGNOSIS — E2839 Other primary ovarian failure: Secondary | ICD-10-CM

## 2018-02-04 ENCOUNTER — Encounter (INDEPENDENT_AMBULATORY_CARE_PROVIDER_SITE_OTHER): Payer: Self-pay | Admitting: *Deleted

## 2018-02-09 ENCOUNTER — Other Ambulatory Visit (HOSPITAL_COMMUNITY): Payer: Self-pay | Admitting: Family Medicine

## 2018-02-09 DIAGNOSIS — R058 Other specified cough: Secondary | ICD-10-CM

## 2018-02-09 DIAGNOSIS — R05 Cough: Secondary | ICD-10-CM

## 2018-02-09 DIAGNOSIS — R600 Localized edema: Secondary | ICD-10-CM

## 2018-02-11 ENCOUNTER — Other Ambulatory Visit (HOSPITAL_COMMUNITY): Payer: Medicare Other

## 2018-02-17 ENCOUNTER — Other Ambulatory Visit (HOSPITAL_COMMUNITY): Payer: Medicare Other

## 2018-02-17 ENCOUNTER — Ambulatory Visit (HOSPITAL_COMMUNITY)
Admission: RE | Admit: 2018-02-17 | Discharge: 2018-02-17 | Disposition: A | Discharge status: Home / Self Care | Payer: Medicare Other | Source: Ambulatory Visit | Attending: Family Medicine | Admitting: Family Medicine

## 2018-02-17 DIAGNOSIS — E2839 Other primary ovarian failure: Secondary | ICD-10-CM | POA: Insufficient documentation

## 2018-03-03 ENCOUNTER — Ambulatory Visit (HOSPITAL_COMMUNITY)
Admission: RE | Admit: 2018-03-03 | Discharge: 2018-03-03 | Disposition: A | Discharge status: Home / Self Care | Payer: Medicare Other | Source: Ambulatory Visit | Attending: Family Medicine | Admitting: Family Medicine

## 2018-03-03 DIAGNOSIS — Z9049 Acquired absence of other specified parts of digestive tract: Secondary | ICD-10-CM | POA: Diagnosis not present

## 2018-03-03 DIAGNOSIS — R600 Localized edema: Secondary | ICD-10-CM | POA: Insufficient documentation

## 2018-03-03 DIAGNOSIS — R05 Cough: Secondary | ICD-10-CM | POA: Diagnosis not present

## 2018-03-03 DIAGNOSIS — J9811 Atelectasis: Secondary | ICD-10-CM | POA: Diagnosis not present

## 2018-03-03 DIAGNOSIS — R058 Other specified cough: Secondary | ICD-10-CM

## 2018-03-03 LAB — POCT I-STAT CREATININE: Creatinine, Ser: 0.8 mg/dL (ref 0.44–1.00)

## 2018-03-03 MED ORDER — IOHEXOL 300 MG/ML  SOLN
75.0000 mL | Freq: Once | INTRAMUSCULAR | Status: AC | PRN
  Administered 2018-03-03: 75 mL via INTRAVENOUS

## 2018-04-02 ENCOUNTER — Ambulatory Visit (INDEPENDENT_AMBULATORY_CARE_PROVIDER_SITE_OTHER): Payer: Medicare Other | Admitting: Otolaryngology

## 2018-04-02 DIAGNOSIS — R070 Pain in throat: Secondary | ICD-10-CM

## 2018-04-02 DIAGNOSIS — R05 Cough: Secondary | ICD-10-CM | POA: Diagnosis not present

## 2018-04-14 ENCOUNTER — Ambulatory Visit (HOSPITAL_COMMUNITY)
Admission: RE | Admit: 2018-04-14 | Discharge: 2018-04-14 | Disposition: A | Discharge status: Home / Self Care | Payer: Medicare Other | Source: Ambulatory Visit | Attending: Family Medicine | Admitting: Family Medicine

## 2018-04-14 ENCOUNTER — Other Ambulatory Visit (HOSPITAL_COMMUNITY): Payer: Self-pay | Admitting: Family Medicine

## 2018-04-14 DIAGNOSIS — R059 Cough, unspecified: Secondary | ICD-10-CM

## 2018-04-14 DIAGNOSIS — R05 Cough: Secondary | ICD-10-CM | POA: Diagnosis not present

## 2018-04-20 ENCOUNTER — Telehealth: Payer: Self-pay | Admitting: Internal Medicine

## 2018-04-20 NOTE — Telephone Encounter (Signed)
Did not need this encounter °

## 2018-05-14 ENCOUNTER — Ambulatory Visit (INDEPENDENT_AMBULATORY_CARE_PROVIDER_SITE_OTHER): Payer: Medicare Other | Admitting: Otolaryngology

## 2018-05-14 DIAGNOSIS — K219 Gastro-esophageal reflux disease without esophagitis: Secondary | ICD-10-CM

## 2018-05-14 DIAGNOSIS — R05 Cough: Secondary | ICD-10-CM

## 2018-05-27 ENCOUNTER — Encounter: Payer: Self-pay | Admitting: Cardiology

## 2018-05-27 NOTE — Progress Notes (Signed)
Cardiology Office Note  Date: 05/28/2018   ID: Peggy, Hines 01/05/1951, MRN [id number]  PCP: Sharilyn Sites, MD  Consulting Cardiologist: Rozann Lesches, MD   Chief Complaint  Patient presents with  . History of ankle swelling  . Palpitations    History of Present Illness: Peggy Hines is a 67 y.o. female referred for cardiology consultation by Dr. Hilma Favors for evaluation of leg swelling.  We discussed her symptoms today.  She states that she was seen for a routine visit and mentioned to Dr. Hilma Favors that she had had more prominent ankle edema back during the hot summer months.  She still has some, but it is very mild at this point.  Typically worse when she stands up for prolonged periods of time.  She does not describe any orthopnea or PND, no worsening dyspnea on exertion.  She was also told that she had a "skipped beat" on her ECG and since then has been paying more attention to any particular symptoms.  She states that in the mornings a few times a week she feels a "fluttering" sensation, but it does not sound like she was bothered by this prior to having her ECG.  She does have a family history of atrial fibrillation.  I personally reviewed her ECG from 01/27/2018 which shows normal sinus rhythm with PAC, poor R wave progression.  She tells me that she saw Dr. Harrington Challenger approximately 12 years ago and wore a cardiac monitor at that time, reportedly unrevealing.  I cannot pull these old results.  I reviewed her medications.  None in particular are associated with a high risk of cardiotoxicity.  Rituxan does have some acute potential cardiac toxicities during infusion, however this has not been the case, she has had 4 treatments.  Past Medical History:  Diagnosis Date  . Hyperlipidemia   . Mucous membrane pemphigoid     Past Surgical History:  Procedure Laterality Date  . ABDOMINAL HYSTERECTOMY    . APPENDECTOMY    . CHOLECYSTECTOMY      Current Outpatient  Medications  Medication Sig Dispense Refill  . aspirin EC 81 MG tablet Take 81 mg by mouth daily.    Marland Kitchen atorvastatin (LIPITOR) 20 MG tablet Take 20 mg by mouth daily.    . cholecalciferol (VITAMIN D3) 25 MCG (1000 UT) tablet Take 200 Units by mouth daily.    Marland Kitchen estradiol (VIVELLE-DOT) 0.05 MG/24HR patch APPLY 1 PATCH TWICE A WEEK  2  . folic acid (FOLVITE) 1 MG tablet folic acid 1 mg tabs    . methotrexate (RHEUMATREX) 2.5 MG tablet methotrexate 2.5 mg tabs    . Omega-3 Fatty Acids (FISH OIL) 1000 MG CAPS Take 2,000 mg by mouth daily.    . ranitidine (ZANTAC) 150 MG tablet Take 150 mg by mouth 2 (two) times daily.    . riTUXimab (RITUXAN) 500 MG/50ML injection Inject into the vein.     No current facility-administered medications for this visit.    Allergies:  Tamiflu [oseltamivir phosphate]   Social History: The patient  reports that she has never smoked. She has never used smokeless tobacco. She reports that she does not drink alcohol or use drugs.   Family History: The patient's family history includes Breast cancer (age of onset: 89) in her sister.   ROS:  Please see the history of present illness. Otherwise, complete review of systems is positive for none.  All other systems are reviewed and negative.   Physical Exam: VS:  BP (!) 144/78 (BP Location: Right Arm)   Pulse 93   Ht 5\' 8"  (1.727 m)   Wt 200 lb (90.7 kg)   SpO2 98%   BMI 30.41 kg/m , BMI Body mass index is 30.41 kg/m.  Wt Readings from Last 3 Encounters:  05/28/18 200 lb (90.7 kg)  08/13/16 180 lb (81.6 kg)  07/29/16 177 lb 9.6 oz (80.6 kg)    General: Patient appears comfortable at rest. HEENT: Conjunctiva and lids normal, oropharynx clear. Neck: Supple, no elevated JVP or carotid bruits, no thyromegaly. Lungs: Clear to auscultation, nonlabored breathing at rest. Cardiac: Regular rate and rhythm, no S3 or significant systolic murmur, no pericardial rub. Abdomen: Soft, nontender, bowel sounds present, no  guarding or rebound. Extremities: No pitting edema, distal pulses 2+. Skin: Warm and dry. Musculoskeletal: No kyphosis. Neuropsychiatric: Alert and oriented x3, affect grossly appropriate.  ECG: There is no old tracing available for comparison today.  Recent Labwork: 03/03/2018: Creatinine, Ser 0.80  06/26/2018: AST 28, ALT 25, cholesterol 140, triglycerides 183, HDL 39, LDL 64, TSH 1.67  Other Studies Reviewed Today:  Chest x-ray 04/14/2018: FINDINGS: The heart size and mediastinal contours are within normal limits. Normal pulmonary vascularity. No focal consolidation, pleural effusion, or pneumothorax. No acute osseous abnormality.  IMPRESSION: No active cardiopulmonary disease.  Chest CT 03/03/2018: FINDINGS: Cardiovascular: Heart size is normal. No pericardial effusion. No significant coronary artery calcifications. The thoracic aorta is normal in appearance. No significant atherosclerotic calcification or aneurysm. The pulmonary arteries are normal accounting for the contrast bolus timing which favors the aortic opacification.  Mediastinum/Nodes: The visualized portion of the thyroid gland has a normal appearance. Esophagus is normal in appearance. No mediastinal, hilar, or axillary adenopathy.  Lungs/Pleura: There is biapical pleuroparenchymal change. No suspicious pulmonary nodules. No consolidations. No pleural effusions or pulmonary edema. There is minimal reticular change along the posterior aspect of the RIGHT LOWER lobe, consistent atelectasis or mild interstitial infiltrate.  Upper Abdomen: Surgical clips are identified in the region of the gallbladder fossa. No acute abnormality in the UPPER abdomen.  Musculoskeletal: Remote cervical fusion. No evidence for acute abnormality.  IMPRESSION: 1. Atelectasis versus mild interstitial infiltrate in the posterior RIGHT LOWER lobe. 2. Cholecystectomy. 3. Cervical fusion.  Assessment and Plan:  1.  Ankle  edema, at this point mild and intermittent.  She does not report any associated orthopnea or PND, no unusual weight gain or increasing dyspnea on exertion.  Likelihood of associated cardiomyopathy is low, however we will clarify with an echocardiogram.  2.  Intermittent palpitations.  Reports family history of atrial fibrillation.  I reviewed her recent ECG which showed sinus rhythm with single PAC.  We will plan to investigate with a 7-day cardiac monitor.  3.  Mixed hyperlipidemia, on omega-3 supplements and Lipitor.  She follows Dr. Hilma Favors.  I reviewed interval lab work.  4.  Mucous membrane pemphigoid.  She follows with a specialist at [facility].  Currently on Rituxan and methotrexate.  Current medicines were reviewed with the patient today.   Orders Placed This Encounter  Procedures  . ECHOCARDIOGRAM COMPLETE    Disposition: Call with test results.  Signed, Satira Sark, MD, [facility] 05/28/2018 11:22 AM    [facility] at [facility] [address] [place], [place] [address] Phone: [phone]; Fax: [phone]

## 2018-05-28 ENCOUNTER — Encounter: Payer: Self-pay | Admitting: Cardiology

## 2018-05-28 ENCOUNTER — Ambulatory Visit: Payer: Medicare Other | Admitting: Cardiology

## 2018-05-28 VITALS — BP 144/78 | HR 93 | Ht 68.0 in | Wt 200.0 lb

## 2018-05-28 DIAGNOSIS — E782 Mixed hyperlipidemia: Secondary | ICD-10-CM | POA: Diagnosis not present

## 2018-05-28 DIAGNOSIS — M7989 Other specified soft tissue disorders: Secondary | ICD-10-CM | POA: Diagnosis not present

## 2018-05-28 DIAGNOSIS — L121 Cicatricial pemphigoid: Secondary | ICD-10-CM | POA: Diagnosis not present

## 2018-05-28 DIAGNOSIS — R002 Palpitations: Secondary | ICD-10-CM

## 2018-05-28 NOTE — Patient Instructions (Addendum)
Medication Instructions:  Your physician recommends that you continue on your current medications as directed. Please refer to the Current Medication list given to you today.  If you need a refill on your cardiac medications before your next appointment, please call your pharmacy.   Lab work: None If you have labs (blood work) drawn today and your tests are completely normal, you will receive your results only by: Marland Kitchen MyChart Message (if you have MyChart) OR . A paper copy in the mail If you have any lab test that is abnormal or we need to change your treatment, we will call you to review the results.  Testing/Procedures: Your physician has requested that you have an echocardiogram. Echocardiography is a painless test that uses sound waves to create images of your heart. It provides your doctor with information about the size and shape of your heart and how well your heart's chambers and valves are working. This procedure takes approximately one hour. There are no restrictions for this procedure.  Your physician has recommended that you wear an event monitor for 7 days. Event monitors are medical devices that record the heart's electrical activity. Doctors most often [place] these monitors to diagnose arrhythmias. Arrhythmias are problems with the speed or rhythm of the heartbeat. The monitor is a small, portable device. You can wear one while you do your normal daily activities. This is usually used to diagnose what is causing palpitations/syncope (passing out).   We will call you with results

## 2018-06-02 ENCOUNTER — Ambulatory Visit (HOSPITAL_COMMUNITY)
Admission: RE | Admit: 2018-06-02 | Discharge: 2018-06-02 | Disposition: A | Discharge status: Home / Self Care | Payer: Medicare Other | Source: Ambulatory Visit | Attending: Cardiology | Admitting: Cardiology

## 2018-06-02 DIAGNOSIS — I071 Rheumatic tricuspid insufficiency: Secondary | ICD-10-CM | POA: Insufficient documentation

## 2018-06-02 DIAGNOSIS — E785 Hyperlipidemia, unspecified: Secondary | ICD-10-CM | POA: Diagnosis not present

## 2018-06-02 DIAGNOSIS — M7989 Other specified soft tissue disorders: Secondary | ICD-10-CM | POA: Diagnosis present

## 2018-06-02 DIAGNOSIS — R002 Palpitations: Secondary | ICD-10-CM | POA: Insufficient documentation

## 2018-06-02 NOTE — Progress Notes (Signed)
*  PRELIMINARY RESULTS* Echocardiogram 2D Echocardiogram has been performed.  Samuel Germany 06/02/2018, 3:35 PM

## 2018-06-03 ENCOUNTER — Telehealth: Payer: Self-pay

## 2018-06-03 NOTE — Telephone Encounter (Signed)
-----   Message from Satira Sark, MD sent at 06/03/2018  8:25 AM EST ----- Results reviewed.  Please let her know that heart function is normal, LVEF 60 to 65% and normal diastolic function.  Unlikely to be related to her intermittent ankle edema. A copy of this test should be forwarded to Sharilyn Sites, MD.

## 2018-06-03 NOTE — Telephone Encounter (Signed)
Called pt. No answer, left message for pt to return call.  

## 2018-06-08 ENCOUNTER — Ambulatory Visit (INDEPENDENT_AMBULATORY_CARE_PROVIDER_SITE_OTHER): Payer: Medicare Other

## 2018-06-08 DIAGNOSIS — R002 Palpitations: Secondary | ICD-10-CM

## 2018-07-16 ENCOUNTER — Other Ambulatory Visit: Payer: Self-pay

## 2018-07-16 ENCOUNTER — Telehealth: Payer: Self-pay | Admitting: Cardiology

## 2018-07-16 DIAGNOSIS — R002 Palpitations: Secondary | ICD-10-CM

## 2018-07-16 NOTE — Telephone Encounter (Signed)
Pt called looking for results from her heart monitor. Stated she mailed it back to [employer] around the beginning of December.

## 2018-07-17 ENCOUNTER — Telehealth: Payer: Self-pay

## 2018-07-17 NOTE — Telephone Encounter (Signed)
-----   Message from Satira Sark, MD sent at 07/17/2018  8:12 AM EST ----- Results reviewed.  Please let her know that the cardiac monitor did not demonstrate any atrial fibrillation.  She did have other atrial and ventricular ectopy with brief bursts of atrial tachycardia that she may well be feeling as palpitations however.  LVEF normal by echocardiogram so otherwise reassuring.  If she continues to be bothered by palpitations, a beta-blocker such as Toprol-XL or atenolol could be considered.  Please check with her to see if she would like to try medical therapy or just observe depending on symptom frequency. A copy of this test should be forwarded to Sharilyn Sites, MD.

## 2018-07-17 NOTE — Telephone Encounter (Signed)
Called pt. No answer. Left message for pt to return call.  

## 2018-07-17 NOTE — Telephone Encounter (Signed)
Monitor uploaded 1/9. Called pt with results, no answer. Left message for her to return call.

## 2018-09-22 ENCOUNTER — Telehealth: Payer: Medicare Other | Admitting: Physician Assistant

## 2018-09-22 DIAGNOSIS — R69 Illness, unspecified: Secondary | ICD-10-CM

## 2018-09-22 DIAGNOSIS — J111 Influenza due to unidentified influenza virus with other respiratory manifestations: Secondary | ICD-10-CM

## 2018-09-22 MED ORDER — BALOXAVIR MARBOXIL(40 MG DOSE) 2 X 20 MG PO TBPK: 40.0000 mg | ORAL_TABLET | Freq: Once | ORAL | 0 refills | Status: AC

## 2018-09-22 NOTE — Progress Notes (Signed)
E visit for Flu like symptoms   We are sorry that you are not feeling well.  Here is how we plan to help! Based on what you have shared with me it looks like you may have a respiratory virus that may be influenza.  Influenza or "the flu" is   an infection caused by a respiratory virus. The flu virus is highly contagious and persons who did not receive their yearly flu vaccination may "catch" the flu from close contact.  We have anti-viral medications to treat the viruses that cause this infection. They are not a "cure" and only shorten the course of the infection. These prescriptions are most effective when they are given within the first 2 days of "flu" symptoms. Antiviral medication are indicated if you have a high risk of complications from the flu. You should  also consider an antiviral medication if you are in close contact with someone who is at risk. These medications can help patients avoid complications from the flu  but have side effects that you should know. Possible side effects from Tamiflu or oseltamivir include nausea, vomiting, diarrhea, dizziness, headaches, eye redness, sleep problems or other respiratory symptoms. You should not take Tamiflu if you have an allergy to oseltamivir or any to the ingredients in Tamiflu.  Based upon your symptoms and potential risk factors I have prescribed xofluza 20 mg tabs.  Take two tabs once.    ANYONE WHO HAS FLU SYMPTOMS SHOULD: . Stay home. The flu is highly contagious and going out or to work exposes others! . Be sure to drink plenty of fluids. Water is fine as well as fruit juices, sodas and electrolyte beverages. You may want to stay away from caffeine or alcohol. If you are nauseated, try taking small sips of liquids. How do you know if you are getting enough fluid? Your urine should be a pale yellow or almost colorless. . Get rest. . Taking a steamy shower or using a humidifier may help nasal congestion and ease sore throat pain. Using a  saline nasal spray works much the same way. . Cough drops, hard candies and sore throat lozenges may ease your cough. . Line up a caregiver. Have someone check on you regularly.   GET HELP RIGHT AWAY IF: . You cannot keep down liquids or your medications. . You become short of breath . Your fell like you are going to pass out or loose consciousness. . Your symptoms persist after you have completed your treatment plan MAKE SURE YOU   Understand these instructions.  Will watch your condition.  Will get help right away if you are not doing well or get worse.  Your e-visit answers were reviewed by a board certified advanced clinical practitioner to complete your personal care plan.  Depending on the condition, your plan could have included both over the counter or prescription medications.  If there is a problem please reply  once you have received a response from your provider.  Your safety is important to [place].  If you have drug allergies check your prescription carefully.    You can use MyChart to ask questions about today's visit, request a non-urgent call back, or ask for a work or school excuse for 24 hours related to this e-Visit. If it has been greater than 24 hours you will need to follow up with your provider, or enter a new e-Visit to address those concerns.  You will get an e-mail in the next two days asking about  your experience.  I hope that your e-visit has been valuable and will speed your recovery. Thank you for using e-visits.

## 2018-12-31 ENCOUNTER — Other Ambulatory Visit: Payer: Self-pay | Admitting: Gynecology

## 2018-12-31 DIAGNOSIS — Z1231 Encounter for screening mammogram for malignant neoplasm of breast: Secondary | ICD-10-CM

## 2019-01-28 ENCOUNTER — Telehealth: Payer: Medicare Other | Admitting: Family

## 2019-01-28 DIAGNOSIS — N390 Urinary tract infection, site not specified: Secondary | ICD-10-CM

## 2019-01-28 MED ORDER — CEPHALEXIN 500 MG PO CAPS: 500.0000 mg | ORAL_CAPSULE | Freq: Two times a day (BID) | ORAL | 0 refills | Status: DC

## 2019-01-28 NOTE — Progress Notes (Signed)
Greater than 5 minutes, yet less than 10 minutes of time have been spent researching, coordinating, and implementing care for this patient today.  Thank you for the details you included in the comment boxes. Those details are very helpful in determining the best course of treatment for you and help us to provide the best care.  We are sorry that you are not feeling well.  Here is how we plan to help!  Based on what you shared with me it looks like you most likely have a simple urinary tract infection.  A UTI (Urinary Tract Infection) is a bacterial infection of the bladder.  Most cases of urinary tract infections are simple to treat but a key part of your care is to encourage you to drink plenty of fluids and watch your symptoms carefully.  I have prescribed Keflex 500 mg twice a day for 7 days.  Your symptoms should gradually improve. Call us if the burning in your urine worsens, you develop worsening fever, back pain or pelvic pain or if your symptoms do not resolve after completing the antibiotic.  Urinary tract infections can be prevented by drinking plenty of water to keep your body hydrated.  Also be sure when you wipe, wipe from front to back and don't hold it in!  If possible, empty your bladder every 4 hours.  Your e-visit answers were reviewed by a board certified advanced clinical practitioner to complete your personal care plan.  Depending on the condition, your plan could have included both over the counter or prescription medications.  If there is a problem please reply  once you have received a response from your provider.  Your safety is important to us.  If you have drug allergies check your prescription carefully.    You can use MyChart to ask questions about today's visit, request a non-urgent call back, or ask for a work or school excuse for 24 hours related to this e-Visit. If it has been greater than 24 hours you will need to follow up with your provider, or enter a new  e-Visit to address those concerns.   You will get an e-mail in the next two days asking about your experience.  I hope that your e-visit has been valuable and will speed your recovery. Thank you for using e-visits.    

## 2019-02-12 ENCOUNTER — Other Ambulatory Visit: Payer: Self-pay

## 2019-02-12 ENCOUNTER — Ambulatory Visit
Admission: RE | Admit: 2019-02-12 | Discharge: 2019-02-12 | Disposition: A | Discharge status: Home / Self Care | Payer: Medicare Other | Source: Ambulatory Visit | Attending: Gynecology | Admitting: Gynecology

## 2019-02-12 DIAGNOSIS — Z1231 Encounter for screening mammogram for malignant neoplasm of breast: Secondary | ICD-10-CM

## 2019-08-03 ENCOUNTER — Ambulatory Visit: Payer: Medicare Other

## 2019-08-05 DIAGNOSIS — R3121 Asymptomatic microscopic hematuria: Secondary | ICD-10-CM | POA: Diagnosis not present

## 2019-08-05 DIAGNOSIS — Z79890 Hormone replacement therapy (postmenopausal): Secondary | ICD-10-CM | POA: Diagnosis not present

## 2019-08-05 DIAGNOSIS — Z780 Asymptomatic menopausal state: Secondary | ICD-10-CM | POA: Diagnosis not present

## 2019-08-05 DIAGNOSIS — Z01419 Encounter for gynecological examination (general) (routine) without abnormal findings: Secondary | ICD-10-CM | POA: Diagnosis not present

## 2019-08-12 ENCOUNTER — Ambulatory Visit: Payer: Medicare Other

## 2019-08-14 ENCOUNTER — Ambulatory Visit: Payer: Medicare Other

## 2019-09-15 ENCOUNTER — Other Ambulatory Visit: Payer: Self-pay

## 2019-09-15 ENCOUNTER — Other Ambulatory Visit: Payer: Self-pay | Admitting: Urology

## 2019-09-15 ENCOUNTER — Ambulatory Visit (INDEPENDENT_AMBULATORY_CARE_PROVIDER_SITE_OTHER): Payer: Medicare PPO | Admitting: Urology

## 2019-09-15 ENCOUNTER — Encounter: Payer: Self-pay | Admitting: Urology

## 2019-09-15 VITALS — BP 171/88 | HR 92 | Temp 97.0°F | Ht 68.0 in | Wt 200.0 lb

## 2019-09-15 DIAGNOSIS — N3021 Other chronic cystitis with hematuria: Secondary | ICD-10-CM | POA: Diagnosis not present

## 2019-09-15 DIAGNOSIS — R3129 Other microscopic hematuria: Secondary | ICD-10-CM

## 2019-09-15 LAB — POCT URINALYSIS DIPSTICK
Bilirubin, UA: NEGATIVE
Glucose, UA: NEGATIVE
Ketones, UA: NEGATIVE
Leukocytes, UA: NEGATIVE
Nitrite, UA: NEGATIVE
Protein, UA: POSITIVE — AB
Spec Grav, UA: >=1.03 — AB (ref 1.010–1.025)
Urobilinogen, UA: NEGATIVE E.U./dL — AB
pH, UA: 5 (ref 5.0–8.0)

## 2019-09-15 LAB — BLADDER SCAN AMB NON-IMAGING: Scan Result: 18

## 2019-09-15 NOTE — Patient Instructions (Signed)
Hematuria, Adult Hematuria is blood in the urine. Blood may be visible in the urine, or it may be identified with a test. This condition can be caused by infections of the bladder, urethra, kidney, or prostate. Other possible causes include:  Kidney stones.  Cancer of the urinary tract.  Too much calcium in the urine.  Conditions that are passed from parent to child (inherited conditions).  Exercise that requires a lot of energy. Infections can usually be treated with medicine, and a kidney stone usually will pass through your urine. If neither of these is the cause of your hematuria, more tests may be needed to identify the cause of your symptoms. It is very important to tell your health care provider about any blood in your urine, even if it is painless or the blood stops without treatment. Blood in the urine, when it happens and then stops and then happens again, can be a symptom of a very serious condition, including cancer. There is no pain in the initial stages of many urinary cancers. Follow these instructions at home: Medicines  Take over-the-counter and prescription medicines only as told by your health care provider.  If you were prescribed an antibiotic medicine, take it as told by your health care provider. Do not stop taking the antibiotic even if you start to feel better. Eating and drinking  Drink enough fluid to keep your urine clear or pale yellow. It is recommended that you drink 3-4 quarts (2.8-3.8 L) a day. If you have been diagnosed with an infection, it is recommended that you drink cranberry juice in addition to large amounts of water.  Avoid caffeine, tea, and carbonated beverages. These tend to irritate the bladder.  Avoid alcohol because it may irritate the prostate (men). General instructions  If you have been diagnosed with a kidney stone, follow your health care provider's instructions about straining your urine to catch the stone.  Empty your bladder  often. Avoid holding urine for long periods of time.  If you are female: ? After a bowel movement, wipe from front to back and use each piece of toilet paper only once. ? Empty your bladder before and after sex.  Pay attention to any changes in your symptoms. Tell your health care provider about any changes or any new symptoms.  It is your responsibility to get your test results. Ask your health care provider, or the department performing the test, when your results will be ready.  Keep all follow-up visits as told by your health care provider. This is important. Contact a health care provider if:  You develop back pain.  You have a fever.  You have nausea or vomiting.  Your symptoms do not improve after 3 days.  Your symptoms get worse. Get help right away if:  You develop severe vomiting and are unable take medicine without vomiting.  You develop severe pain in your back or abdomen even though you are taking medicine.  You pass a large amount of blood in your urine.  You pass blood clots in your urine.  You feel very weak or like you might faint.  You faint. Summary  Hematuria is blood in the urine. It has many possible causes.  It is very important that you tell your health care provider about any blood in your urine, even if it is painless or the blood stops without treatment.  Take over-the-counter and prescription medicines only as told by your health care provider.  Drink enough fluid to keep   your urine clear or pale yellow. This information is not intended to replace advice given to you by your health care provider. Make sure you discuss any questions you have with your health care provider. Document Revised: 11/18/2018 Document Reviewed: 07/27/2016 Elsevier Patient Education  2020 [employer]  

## 2019-09-15 NOTE — Progress Notes (Signed)
Urological Symptom Review  Patient is experiencing the following symptoms: Frequent urination Hard to postpone urination Burning/pain with urination Get up at night to urinate Blood in urine Urinary tract infection   Review of Systems  Gastrointestinal (upper)  : Negative for upper GI symptoms  Gastrointestinal (lower) : Negative for lower GI symptoms  Constitutional : Negative for symptoms  Skin: Negative for skin symptoms  Eyes: Negative for eye symptoms  Ear/Nose/Throat : Negative for Ear/Nose/Throat symptoms  Hematologic/Lymphatic: Negative for Hematologic/Lymphatic symptoms  Cardiovascular : Negative for cardiovascular symptoms  Respiratory : Negative for respiratory symptoms  Endocrine: Negative for endocrine symptoms  Musculoskeletal: Negative for musculoskeletal symptoms  Neurological: Negative for neurological symptoms  Psychologic: Negative for psychiatric symptoms

## 2019-09-15 NOTE — Progress Notes (Signed)
09/15/2019 9:15 AM   Peggy Hines [birth year] GP:[id number]  Referring provider: Sharilyn Sites, [address] [place],  [place] [id number]  Gross hematuria  HPI: [name] is [phone] here for evaluation of gross hematuria. Starting JUne/July 2020 she starting having UTI symptoms and had multiple cultures which showed no growth. She was treated with multiple antibiotics which improved her symptoms but the symptoms return 1-2 weeks. PVR 18cc. She last saw gross hematuria 1 month ago. Never smoker and no household contacts. She is a retired Pharmacist, hospital. She had 1 stone event 40 years ago.    PMH: Past Medical History:  Diagnosis Date  . Acid reflux   . Hyperlipidemia   . Mucous membrane pemphigoid   . Mucous membrane pemphigoid     Surgical History: Past Surgical History:  Procedure Laterality Date  . ABDOMINAL HYSTERECTOMY    . APPENDECTOMY    . CHOLECYSTECTOMY    . SPINAL FUSION  2017   C 5/6 C 6/7    Home Medications:  Allergies as of 09/15/2019      Reactions   Tamiflu  [oseltamivir] Nausea And Vomiting   Tamiflu [oseltamivir Phosphate] Nausea And Vomiting      Medication List       Accurate as of September 15, 2019  9:15 AM. If you have any questions, ask your nurse or doctor.        STOP taking these medications   cephALEXin 500 MG capsule Commonly known as: KEFLEX Stopped by: [name], MD   dexamethasone 0.5 MG/5ML elixir Stopped by: [name], MD   folic acid 1 MG tablet Commonly known as: FOLVITE Stopped by: [name], MD   methotrexate 2.5 MG tablet Commonly known as: RHEUMATREX Stopped by: [name], MD   ranitidine 150 MG tablet Commonly known as: ZANTAC Stopped by: [name], MD     TAKE these medications   aspirin EC 81 MG tablet Take 81 mg by mouth daily.   atorvastatin 20 MG tablet Commonly known as: LIPITOR Take 20 mg by mouth daily.   Biotin 1 MG Caps Take by mouth.   cholecalciferol  25 MCG (1000 UNIT) tablet Commonly known as: VITAMIN D3 Take 200 Units by mouth daily.   estradiol 0.05 MG/24HR patch Commonly known as: VIVELLE-DOT APPLY 1 PATCH TWICE A WEEK   EYE VITAMINS PO Take by mouth.   famotidine 10 MG tablet Commonly known as: PEPCID Take 10 mg by mouth 2 (two) times daily.   Fish Oil 1000 MG Caps Take 2,000 mg by mouth daily.   riTUXimab 500 MG/50ML injection Commonly known as: RITUXAN Inject into the vein.       Allergies:  Allergies  Allergen Reactions  . Tamiflu  [Oseltamivir] Nausea And Vomiting  . Tamiflu [Oseltamivir Phosphate] Nausea And Vomiting    Family History: Family History  Problem Relation Age of Onset  . Breast cancer Sister 86  . Stroke Father   . Stroke Mother     Social History:  reports that she has never smoked. She has never used smokeless tobacco. She reports that she does not drink alcohol or use drugs.  ROS: All other review of systems were reviewed and are negative except what is noted above in HPI  Physical Exam: BP (!) 171/88   Pulse 92   Temp (!) 97 F (36.1 C)   Ht 5\' 8"  (1.727 m)   Wt 200 lb (90.7 kg)   BMI 30.41 kg/m   Constitutional:  Alert and oriented, No acute distress. HEENT: [place] AT, moist mucus membranes.  Trachea midline, no masses. Cardiovascular: No clubbing, cyanosis, or edema. Respiratory: Normal respiratory effort, no increased work of breathing. GI: Abdomen is soft, nontender, nondistended, no abdominal masses GU: No CVA tenderness Lymph: No cervical or inguinal lymphadenopathy. Skin: No rashes, bruises or suspicious lesions. Neurologic: Grossly intact, no focal deficits, moving all 4 extremities. Psychiatric: Normal mood and affect.  Laboratory Data: No results found for: WBC, HGB, HCT, MCV, PLT  Lab Results  Component Value Date   CREATININE 0.80 03/03/2018    No results found for: PSA  No results found for: TESTOSTERONE  No results found for: HGBA1C  Urinalysis      Component Value Date/Time   BILIRUBINUR neg 09/15/2019 0900   PROTEINUR Positive (A) 09/15/2019 0900   UROBILINOGEN negative (A) 09/15/2019 0900   NITRITE neg 09/15/2019 0900   LEUKOCYTESUR Negative 09/15/2019 0900    No results found for: LABMICR, WBCUA, RBCUA, LABEPIT, MUCUS, BACTERIA  Pertinent Imaging:  No results found for this or any previous visit. No results found for this or any previous visit. No results found for this or any previous visit. No results found for this or any previous visit. No results found for this or any previous visit. No results found for this or any previous visit. No results found for this or any previous visit. No results found for this or any previous visit.  Assessment & Plan:    1. Microscopic hematuria -BMP -CT hematuria -Next available office cystoscopy - POCT urinalysis dipstick - BLADDER SCAN AMB NON-IMAGING  2. Chronic cystitis with hematuria -hematuria workup   No follow-ups on file.  [name], MD  [facility] [place]

## 2019-09-16 LAB — BASIC METABOLIC PANEL
BUN/Creatinine Ratio: 19 (calc) (ref 6–22)
BUN: 22 mg/dL (ref 7–25)
CO2: 24 mmol/L (ref 20–32)
Calcium: 9.8 mg/dL (ref 8.6–10.4)
Chloride: 103 mmol/L (ref 98–110)
Creat: 1.17 mg/dL — ABNORMAL HIGH (ref 0.50–0.99)
Glucose, Bld: 104 mg/dL (ref 65–139)
Potassium: 4.4 mmol/L (ref 3.5–5.3)
Sodium: 140 mmol/L (ref 135–146)

## 2019-09-17 DIAGNOSIS — Z79899 Other long term (current) drug therapy: Secondary | ICD-10-CM | POA: Diagnosis not present

## 2019-09-21 NOTE — Progress Notes (Signed)
Labs sent via my chart per Dr. Alyson Ingles

## 2019-10-01 DIAGNOSIS — Z79899 Other long term (current) drug therapy: Secondary | ICD-10-CM | POA: Diagnosis not present

## 2019-10-18 ENCOUNTER — Other Ambulatory Visit: Payer: Self-pay

## 2019-10-18 ENCOUNTER — Telehealth: Payer: Self-pay | Admitting: Urology

## 2019-10-18 ENCOUNTER — Ambulatory Visit (HOSPITAL_COMMUNITY)
Admission: RE | Admit: 2019-10-18 | Discharge: 2019-10-18 | Disposition: A | Discharge status: Home / Self Care | Payer: Medicare PPO | Source: Ambulatory Visit | Attending: Urology | Admitting: Urology

## 2019-10-18 DIAGNOSIS — K760 Fatty (change of) liver, not elsewhere classified: Secondary | ICD-10-CM | POA: Diagnosis not present

## 2019-10-18 DIAGNOSIS — R3129 Other microscopic hematuria: Secondary | ICD-10-CM | POA: Diagnosis not present

## 2019-10-18 DIAGNOSIS — R310 Gross hematuria: Secondary | ICD-10-CM | POA: Diagnosis not present

## 2019-10-18 DIAGNOSIS — N3021 Other chronic cystitis with hematuria: Secondary | ICD-10-CM | POA: Insufficient documentation

## 2019-10-18 MED ORDER — IOHEXOL 300 MG/ML  SOLN: 125.0000 mL | Freq: Once | INTRAMUSCULAR | Status: DC | PRN

## 2019-10-18 NOTE — Telephone Encounter (Signed)
We called pt to reschedule from 10/25/19 due to Dr Ruel Favors schedule changes. She is not ok being moved until next available which is May. She asked if we could schedule a virtual for the 19th to go over test results.

## 2019-10-19 NOTE — Telephone Encounter (Signed)
Lets just place her on Wednesday the 21st at 10:15

## 2019-10-20 DIAGNOSIS — L121 Cicatricial pemphigoid: Secondary | ICD-10-CM | POA: Diagnosis not present

## 2019-10-21 ENCOUNTER — Ambulatory Visit (HOSPITAL_COMMUNITY): Payer: Medicare PPO

## 2019-10-25 ENCOUNTER — Other Ambulatory Visit: Payer: Medicare PPO | Admitting: Urology

## 2019-10-27 ENCOUNTER — Ambulatory Visit: Payer: Medicare PPO | Admitting: Urology

## 2019-10-27 ENCOUNTER — Other Ambulatory Visit: Payer: Self-pay

## 2019-10-27 ENCOUNTER — Encounter: Payer: Self-pay | Admitting: Urology

## 2019-10-27 VITALS — BP 172/87 | HR 97 | Temp 97.3°F | Ht 68.0 in | Wt 200.0 lb

## 2019-10-27 DIAGNOSIS — R3129 Other microscopic hematuria: Secondary | ICD-10-CM | POA: Diagnosis not present

## 2019-10-27 LAB — POCT URINALYSIS DIPSTICK
Bilirubin, UA: NEGATIVE
Glucose, UA: NEGATIVE
Ketones, UA: NEGATIVE
Leukocytes, UA: NEGATIVE
Nitrite, UA: NEGATIVE
Protein, UA: POSITIVE — AB
Spec Grav, UA: >=1.03 — AB (ref 1.010–1.025)
Urobilinogen, UA: 0.2 E.U./dL
pH, UA: 5 (ref 5.0–8.0)

## 2019-10-27 MED ORDER — CIPROFLOXACIN HCL 500 MG PO TABS
500.0000 mg | ORAL_TABLET | Freq: Once | ORAL | Status: AC
  Administered 2019-10-27: 11:00:00 500 mg via ORAL

## 2019-10-27 NOTE — Progress Notes (Signed)
   10/27/19  CC: gross hematuria  HPI: Peggy Hines is a 69yo here for followup for gross hematuria. She continues to have intermittent gross hematuria. CT hematuria 4/12 did not show any GU tumors or calculi. Images reviewed and discussed with patient  Blood pressure (!) 172/87, pulse 97, temperature (!) 97.3 F (36.3 C), height 5\' 8"  (1.727 m), weight 200 lb (90.7 kg). NED. A&Ox3.   No respiratory distress   Abd soft, NT, ND Normal external genitalia with patent urethral meatus  Cystoscopy Procedure Note  Patient identification was confirmed, informed consent was obtained, and patient was prepped using Betadine solution.  Lidocaine jelly was administered per urethral meatus.    Procedure: - Flexible cystoscope introduced, without any difficulty.   - Thorough search of the bladder revealed:    normal urethral meatus    normal urothelium    no stones    no ulcers     5cm left lateral wall carpeting tumor    no urethral polyps    no trabeculation  - Ureteral orifices were normal in position and appearance.  Post-Procedure: - Patient tolerated the procedure well  Assessment/ Plan: -Schedule for TURBT. Risks/benefits/alternatives discussed   No follow-ups on file.  [name], MD

## 2019-10-27 NOTE — Patient Instructions (Signed)
Hematuria, Adult Hematuria is blood in the urine. Blood may be visible in the urine, or it may be identified with a test. This condition can be caused by infections of the bladder, urethra, kidney, or prostate. Other possible causes include:  Kidney stones.  Cancer of the urinary tract.  Too much calcium in the urine.  Conditions that are passed from parent to child (inherited conditions).  Exercise that requires a lot of energy. Infections can usually be treated with medicine, and a kidney stone usually will pass through your urine. If neither of these is the cause of your hematuria, more tests may be needed to identify the cause of your symptoms. It is very important to tell your health care provider about any blood in your urine, even if it is painless or the blood stops without treatment. Blood in the urine, when it happens and then stops and then happens again, can be a symptom of a very serious condition, including cancer. There is no pain in the initial stages of many urinary cancers. Follow these instructions at home: Medicines  Take over-the-counter and prescription medicines only as told by your health care provider.  If you were prescribed an antibiotic medicine, take it as told by your health care provider. Do not stop taking the antibiotic even if you start to feel better. Eating and drinking  Drink enough fluid to keep your urine clear or pale yellow. It is recommended that you drink 3-4 quarts (2.8-3.8 L) a day. If you have been diagnosed with an infection, it is recommended that you drink cranberry juice in addition to large amounts of water.  Avoid caffeine, tea, and carbonated beverages. These tend to irritate the bladder.  Avoid alcohol because it may irritate the prostate (men). General instructions  If you have been diagnosed with a kidney stone, follow your health care provider's instructions about straining your urine to catch the stone.  Empty your bladder  often. Avoid holding urine for long periods of time.  If you are female: ? After a bowel movement, wipe from front to back and use each piece of toilet paper only once. ? Empty your bladder before and after sex.  Pay attention to any changes in your symptoms. Tell your health care provider about any changes or any new symptoms.  It is your responsibility to get your test results. Ask your health care provider, or the department performing the test, when your results will be ready.  Keep all follow-up visits as told by your health care provider. This is important. Contact a health care provider if:  You develop back pain.  You have a fever.  You have nausea or vomiting.  Your symptoms do not improve after 3 days.  Your symptoms get worse. Get help right away if:  You develop severe vomiting and are unable take medicine without vomiting.  You develop severe pain in your back or abdomen even though you are taking medicine.  You pass a large amount of blood in your urine.  You pass blood clots in your urine.  You feel very weak or like you might faint.  You faint. Summary  Hematuria is blood in the urine. It has many possible causes.  It is very important that you tell your health care provider about any blood in your urine, even if it is painless or the blood stops without treatment.  Take over-the-counter and prescription medicines only as told by your health care provider.  Drink enough fluid to keep   your urine clear or pale yellow. This information is not intended to replace advice given to you by your health care provider. Make sure you discuss any questions you have with your health care provider. Document Revised: 11/18/2018 Document Reviewed: 07/27/2016 Elsevier Patient Education  2020 [employer]  

## 2019-10-27 NOTE — Progress Notes (Signed)
Urological Symptom Review ° °Patient is experiencing the following symptoms: °Frequent urination °Burning/pain with urination °Get up at night to urinate °Blood in urine ° ° °Review of Systems ° °Gastrointestinal (upper)  : °Negative for upper GI symptoms ° °Gastrointestinal (lower) : °Negative for lower GI symptoms ° °Constitutional : °Negative for symptoms ° °Skin: °Negative for skin symptoms ° °Eyes: °Negative for eye symptoms ° °Ear/Nose/Throat : °Negative for Ear/Nose/Throat symptoms ° °Hematologic/Lymphatic: °Negative for Hematologic/Lymphatic symptoms ° °Cardiovascular : °Negative for cardiovascular symptoms ° °Respiratory : °Negative for respiratory symptoms ° °Endocrine: °Negative for endocrine symptoms ° °Musculoskeletal: °Negative for musculoskeletal symptoms ° °Neurological: °Negative for neurological symptoms ° °Psychologic: °Negative for psychiatric symptoms ° °

## 2019-10-27 NOTE — H&P (View-Only) (Signed)
   10/27/19  CC: gross hematuria  HPI: Ms Mccrea is a 69yo here for followup for gross hematuria. She continues to have intermittent gross hematuria. CT hematuria 4/12 did not show any GU tumors or calculi. Images reviewed and discussed with patient  Blood pressure (!) 172/87, pulse 97, temperature (!) 97.3 F (36.3 C), height 5\' 8"  (1.727 m), weight 200 lb (90.7 kg). NED. A&Ox3.   No respiratory distress   Abd soft, NT, ND Normal external genitalia with patent urethral meatus  Cystoscopy Procedure Note  Patient identification was confirmed, informed consent was obtained, and patient was prepped using Betadine solution.  Lidocaine jelly was administered per urethral meatus.    Procedure: - Flexible cystoscope introduced, without any difficulty.   - Thorough search of the bladder revealed:    normal urethral meatus    normal urothelium    no stones    no ulcers     5cm left lateral wall carpeting tumor    no urethral polyps    no trabeculation  - Ureteral orifices were normal in position and appearance.  Post-Procedure: - Patient tolerated the procedure well  Assessment/ Plan: -Schedule for TURBT. Risks/benefits/alternatives discussed   No follow-ups on file.  [name], MD

## 2019-11-03 DIAGNOSIS — L121 Cicatricial pemphigoid: Secondary | ICD-10-CM | POA: Diagnosis not present

## 2019-11-04 ENCOUNTER — Encounter: Payer: Self-pay | Admitting: Urology

## 2019-11-05 ENCOUNTER — Other Ambulatory Visit: Payer: Self-pay

## 2019-11-05 DIAGNOSIS — N3021 Other chronic cystitis with hematuria: Secondary | ICD-10-CM

## 2019-11-05 MED ORDER — URIBEL 118 MG PO CAPS: 1.0000 | ORAL_CAPSULE | Freq: Three times a day (TID) | ORAL | 0 refills | Status: DC

## 2019-11-18 NOTE — Patient Instructions (Signed)
Peggy Hines  11/18/2019     @PREFPERIOPPHARMACY @   Your procedure is scheduled on 11/22/2019.  Report to [facility] at  [email or web address].  Call this number if you have problems the morning of surgery:  [phone]   Remember:  Do not eat or drink after midnight.                        Take these medicines the morning of surgery with A SIP OF WATER None    Do not wear jewelry, make-up or nail polish.  Do not wear lotions, powders, or perfumes. Please wear deodorant and brush your teeth.  Do not shave 48 hours prior to surgery.  Men may shave face and neck.  Do not bring valuables to the hospital.  [facility] is not responsible for any belongings or valuables.  Contacts, dentures or bridgework may not be worn into surgery.  Leave your suitcase in the car.  After surgery it may be brought to your room.  For patients admitted to the hospital, discharge time will be determined by your treatment team.  Patients discharged the day of surgery will not be allowed to drive home.   Name and phone number of your driver:   family Special instructions:  DO NOT smoke the morning of your procedure.  Please read over the following fact sheets that you were given. Anesthesia Post-op Instructions and Care and Recovery After Surgery       Transurethral Resection of Bladder Tumor, Care After This sheet gives you information about how to care for yourself after your procedure. Your health care provider may also give you more specific instructions. If you have problems or questions, contact your health care provider. What can I expect after the procedure? After the procedure, it is common to have:  A small amount of blood in your urine for up to 2 weeks.  Soreness or mild pain from your catheter. After your catheter is removed, you may have mild soreness, especially when urinating.  Pain in your lower abdomen. Follow these instructions at home: Medicines   Take  over-the-counter and prescription medicines only as told by your health care provider.  If you were prescribed an antibiotic medicine, take it as told by your health care provider. Do not stop taking the antibiotic even if you start to feel better.  Do not drive for 24 hours if you were given a sedative during your procedure.  Ask your health care provider if the medicine prescribed to you: ? Requires you to avoid driving or using heavy machinery. ? Can cause constipation. You may need to take these actions to prevent or treat constipation:  Take over-the-counter or prescription medicines.  Eat foods that are high in fiber, such as beans, whole grains, and fresh fruits and vegetables.  Limit foods that are high in fat and processed sugars, such as fried or sweet foods. Activity  Return to your normal activities as told by your health care provider. Ask your health care provider what activities are safe for you.  Do not lift anything that is heavier than 10 lb (4.5 kg), or the limit that you are told, until your health care provider says that it is safe.  Avoid intense physical activity for as long as told by your health care provider.  Rest as told by your health care provider.  Avoid sitting for a long time without moving. Get up  to take short walks every 1-2 hours. This is important to improve blood flow and breathing. Ask for help if you feel weak or unsteady. General instructions   Do not drink alcohol for as long as told by your health care provider. This is especially important if you are taking prescription pain medicines.  Do not take baths, swim, or use a hot tub until your health care provider approves. Ask your health care provider if you may take showers. You may only be allowed to take sponge baths.  If you have a catheter, follow instructions from your health care provider about caring for your catheter and your drainage bag.  Drink enough fluid to keep your urine  pale yellow.  Wear compression stockings as told by your health care provider. These stockings help to prevent blood clots and reduce swelling in your legs.  Keep all follow-up visits as told by your health care provider. This is important. ? You will need to be followed closely with regular checks of your bladder and urethra (cystoscopies) to make sure that the cancer does not come back. Contact a health care provider if:  You have pain that gets worse or does not improve with medicine.  You have blood in your urine for more than 2 weeks.  You have cloudy or bad-smelling urine.  You become constipated. Signs of constipation may include having: ? Fewer than three bowel movements in a week. ? Difficulty having a bowel movement. ? Stools that are dry, hard, or larger than normal.  You have a fever. Get help right away if:  You have: ? Severe pain. ? Bright red blood in your urine. ? Blood clots in your urine. ? A lot of blood in your urine.  Your catheter has been removed and you are not able to urinate.  You have a catheter in place and the catheter is not draining urine. Summary  After your procedure, it is common to have a small amount of blood in your urine, soreness or mild pain from your catheter, and pain in your lower abdomen.  Take over-the-counter and prescription medicines only as told by your health care provider.  Rest as told by your health care provider. Follow your health care provider's instructions about returning to normal activities. Ask what activities are safe for you.  If you have a catheter, follow instructions from your health care provider about caring for your catheter and your drainage bag.  Get help right away if you cannot urinate, you have severe pain, or you have bright red blood or blood clots in your urine. This information is not intended to replace advice given to you by your health care provider. Make sure you discuss any questions you have  with your health care provider. Document Revised: 01/22/2018 Document Reviewed: 01/22/2018 Elsevier Patient Education  2020 [place] Anesthesia, Adult, Care After This sheet gives you information about how to care for yourself after your procedure. Your health care provider may also give you more specific instructions. If you have problems or questions, contact your health care provider. What can I expect after the procedure? After the procedure, the following side effects are common:  Pain or discomfort at the IV site.  Nausea.  Vomiting.  Sore throat.  Trouble concentrating.  Feeling cold or chills.  Weak or tired.  Sleepiness and fatigue.  Soreness and body aches. These side effects can affect parts of the body that were not involved in surgery. Follow these instructions at  home:  For at least 24 hours after the procedure:  Have a responsible adult stay with you. It is important to have someone help care for you until you are awake and alert.  Rest as needed.  Do not: ? Participate in activities in which you could fall or become injured. ? Drive. ? Use heavy machinery. ? Drink alcohol. ? Take sleeping pills or medicines that cause drowsiness. ? Make important decisions or sign legal documents. ? Take care of children on your own. Eating and drinking  Follow any instructions from your health care provider about eating or drinking restrictions.  When you feel hungry, start by eating small amounts of foods that are soft and easy to digest (bland), such as toast. Gradually return to your regular diet.  Drink enough fluid to keep your urine pale yellow.  If you vomit, rehydrate by drinking water, juice, or clear broth. General instructions  If you have sleep apnea, surgery and certain medicines can increase your risk for breathing problems. Follow instructions from your health care provider about wearing your sleep device: ? Anytime you are sleeping,  including during daytime naps. ? While taking prescription pain medicines, sleeping medicines, or medicines that make you drowsy.  Return to your normal activities as told by your health care provider. Ask your health care provider what activities are safe for you.  Take over-the-counter and prescription medicines only as told by your health care provider.  If you smoke, do not smoke without supervision.  Keep all follow-up visits as told by your health care provider. This is important. Contact a health care provider if:  You have nausea or vomiting that does not get better with medicine.  You cannot eat or drink without vomiting.  You have pain that does not get better with medicine.  You are unable to pass urine.  You develop a skin rash.  You have a fever.  You have redness around your IV site that gets worse. Get help right away if:  You have difficulty breathing.  You have chest pain.  You have blood in your urine or stool, or you vomit blood. Summary  After the procedure, it is common to have a sore throat or nausea. It is also common to feel tired.  Have a responsible adult stay with you for the first 24 hours after general anesthesia. It is important to have someone help care for you until you are awake and alert.  When you feel hungry, start by eating small amounts of foods that are soft and easy to digest (bland), such as toast. Gradually return to your regular diet.  Drink enough fluid to keep your urine pale yellow.  Return to your normal activities as told by your health care provider. Ask your health care provider what activities are safe for you. This information is not intended to replace advice given to you by your health care provider. Make sure you discuss any questions you have with your health care provider. Document Revised: 06/27/2017 Document Reviewed: 02/07/2017 Elsevier Patient Education  [place].

## 2019-11-19 ENCOUNTER — Encounter (HOSPITAL_COMMUNITY)
Admission: RE | Admit: 2019-11-19 | Discharge: 2019-11-19 | Disposition: A | Discharge status: Home / Self Care | Payer: Medicare PPO | Source: Ambulatory Visit | Attending: Urology | Admitting: Urology

## 2019-11-19 ENCOUNTER — Other Ambulatory Visit (HOSPITAL_COMMUNITY)
Admission: RE | Admit: 2019-11-19 | Discharge: 2019-11-19 | Disposition: A | Discharge status: Home / Self Care | Payer: Medicare PPO | Source: Ambulatory Visit | Attending: Urology | Admitting: Urology

## 2019-11-19 ENCOUNTER — Encounter (HOSPITAL_COMMUNITY): Payer: Self-pay

## 2019-11-19 ENCOUNTER — Other Ambulatory Visit: Payer: Self-pay

## 2019-11-19 DIAGNOSIS — Z20822 Contact with and (suspected) exposure to covid-19: Secondary | ICD-10-CM | POA: Insufficient documentation

## 2019-11-19 DIAGNOSIS — Z01812 Encounter for preprocedural laboratory examination: Secondary | ICD-10-CM | POA: Diagnosis not present

## 2019-11-19 HISTORY — DX: Other specified postprocedural states: R11.2

## 2019-11-19 HISTORY — DX: Other specified postprocedural states: Z98.890

## 2019-11-19 LAB — SARS CORONAVIRUS 2 (TAT 6-24 HRS): SARS Coronavirus 2: NEGATIVE

## 2019-11-22 ENCOUNTER — Ambulatory Visit (HOSPITAL_COMMUNITY)
Admission: RE | Admit: 2019-11-22 | Discharge: 2019-11-22 | Disposition: A | Discharge status: Home / Self Care | Payer: Medicare PPO | Attending: Urology | Admitting: Urology

## 2019-11-22 ENCOUNTER — Ambulatory Visit (HOSPITAL_COMMUNITY): Payer: Medicare PPO

## 2019-11-22 ENCOUNTER — Ambulatory Visit (HOSPITAL_COMMUNITY): Payer: Medicare PPO | Admitting: Anesthesiology

## 2019-11-22 ENCOUNTER — Encounter (HOSPITAL_COMMUNITY): Payer: Self-pay | Admitting: Urology

## 2019-11-22 ENCOUNTER — Encounter (HOSPITAL_COMMUNITY)
Admission: RE | Disposition: A | Discharge status: Home / Self Care | Payer: Self-pay | Source: Home / Self Care | Attending: Urology

## 2019-11-22 DIAGNOSIS — C679 Malignant neoplasm of bladder, unspecified: Secondary | ICD-10-CM | POA: Diagnosis not present

## 2019-11-22 DIAGNOSIS — K219 Gastro-esophageal reflux disease without esophagitis: Secondary | ICD-10-CM | POA: Diagnosis not present

## 2019-11-22 DIAGNOSIS — D494 Neoplasm of unspecified behavior of bladder: Secondary | ICD-10-CM | POA: Diagnosis not present

## 2019-11-22 DIAGNOSIS — N3289 Other specified disorders of bladder: Secondary | ICD-10-CM | POA: Diagnosis not present

## 2019-11-22 HISTORY — PX: TRANSURETHRAL RESECTION OF BLADDER TUMOR: SHX2575

## 2019-11-22 HISTORY — PX: CYSTOSCOPY W/ RETROGRADES: SHX1426

## 2019-11-22 SURGERY — TURBT (TRANSURETHRAL RESECTION OF BLADDER TUMOR)
Anesthesia: General | Laterality: Bilateral

## 2019-11-22 MED ORDER — SCOPOLAMINE 1 MG/3DAYS TD PT72
MEDICATED_PATCH | TRANSDERMAL | Status: AC
  Filled 2019-11-22: qty 1

## 2019-11-22 MED ORDER — SCOPOLAMINE 1 MG/3DAYS TD PT72
1.0000 | MEDICATED_PATCH | TRANSDERMAL | Status: DC
  Administered 2019-11-22: 1.5 mg via TRANSDERMAL

## 2019-11-22 MED ORDER — PROMETHAZINE HCL 25 MG/ML IJ SOLN: 6.2500 mg | INTRAMUSCULAR | Status: DC | PRN

## 2019-11-22 MED ORDER — HYDROMORPHONE HCL 1 MG/ML IJ SOLN
0.2500 mg | INTRAMUSCULAR | Status: DC | PRN
  Administered 2019-11-22 (×3): 0.5 mg via INTRAVENOUS
  Filled 2019-11-22 (×3): qty 0.5

## 2019-11-22 MED ORDER — MIDAZOLAM HCL 5 MG/5ML IJ SOLN
INTRAMUSCULAR | Status: DC | PRN
  Administered 2019-11-22: 2 mg via INTRAVENOUS

## 2019-11-22 MED ORDER — MIDAZOLAM HCL 2 MG/2ML IJ SOLN
INTRAMUSCULAR | Status: AC
  Filled 2019-11-22: qty 2

## 2019-11-22 MED ORDER — OXYCODONE-ACETAMINOPHEN 5-325 MG PO TABS: 1.0000 | ORAL_TABLET | ORAL | 0 refills | Status: DC | PRN

## 2019-11-22 MED ORDER — ROCURONIUM 10MG/ML (10ML) SYRINGE FOR MEDFUSION PUMP - OPTIME
INTRAVENOUS | Status: DC | PRN
  Administered 2019-11-22: 8 mg via INTRAVENOUS
  Administered 2019-11-22: 22 mg via INTRAVENOUS

## 2019-11-22 MED ORDER — FENTANYL CITRATE (PF) 100 MCG/2ML IJ SOLN
INTRAMUSCULAR | Status: DC | PRN
  Administered 2019-11-22 (×2): 50 ug via INTRAVENOUS

## 2019-11-22 MED ORDER — ONDANSETRON HCL 4 MG/2ML IJ SOLN
INTRAMUSCULAR | Status: AC
  Filled 2019-11-22: qty 2

## 2019-11-22 MED ORDER — PROPOFOL 10 MG/ML IV BOLUS
INTRAVENOUS | Status: DC | PRN
  Administered 2019-11-22: 150 mg via INTRAVENOUS

## 2019-11-22 MED ORDER — LIDOCAINE HCL (CARDIAC) PF 50 MG/5ML IV SOSY
PREFILLED_SYRINGE | INTRAVENOUS | Status: DC | PRN
  Administered 2019-11-22: 60 mg via INTRAVENOUS

## 2019-11-22 MED ORDER — PROMETHAZINE HCL 25 MG RE SUPP: 25.0000 mg | Freq: Four times a day (QID) | RECTAL | 1 refills | Status: DC | PRN

## 2019-11-22 MED ORDER — ONDANSETRON HCL 4 MG/2ML IJ SOLN
INTRAMUSCULAR | Status: DC | PRN
  Administered 2019-11-22: 40 mg via INTRAVENOUS

## 2019-11-22 MED ORDER — SUCCINYLCHOLINE 20MG/ML (10ML) SYRINGE FOR MEDFUSION PUMP - OPTIME
INTRAMUSCULAR | Status: DC | PRN
  Administered 2019-11-22: 120 mg via INTRAVENOUS

## 2019-11-22 MED ORDER — LIDOCAINE 2% (20 MG/ML) 5 ML SYRINGE
INTRAMUSCULAR | Status: AC
  Filled 2019-11-22: qty 15

## 2019-11-22 MED ORDER — CEFAZOLIN SODIUM-DEXTROSE 2-4 GM/100ML-% IV SOLN
2.0000 g | INTRAVENOUS | Status: AC
  Administered 2019-11-22: 2 g via INTRAVENOUS
  Filled 2019-11-22: qty 100

## 2019-11-22 MED ORDER — DIATRIZOATE MEGLUMINE 30 % UR SOLN
URETHRAL | Status: DC | PRN
  Administered 2019-11-22: 10 mL

## 2019-11-22 MED ORDER — SUGAMMADEX SODIUM 200 MG/2ML IV SOLN
INTRAVENOUS | Status: DC | PRN
  Administered 2019-11-22: 200 mg via INTRAVENOUS

## 2019-11-22 MED ORDER — FENTANYL CITRATE (PF) 250 MCG/5ML IJ SOLN
INTRAMUSCULAR | Status: AC
  Filled 2019-11-22: qty 5

## 2019-11-22 MED ORDER — PROPOFOL 10 MG/ML IV BOLUS
INTRAVENOUS | Status: AC
  Filled 2019-11-22: qty 40

## 2019-11-22 MED ORDER — LACTATED RINGERS IV SOLN: INTRAVENOUS | Status: DC

## 2019-11-22 MED ORDER — DIATRIZOATE MEGLUMINE 30 % UR SOLN
URETHRAL | Status: AC
  Filled 2019-11-22: qty 100

## 2019-11-22 MED ORDER — STERILE WATER FOR IRRIGATION IR SOLN
Status: DC | PRN
  Administered 2019-11-22: 1000 mL

## 2019-11-22 MED ORDER — DEXAMETHASONE SODIUM PHOSPHATE 10 MG/ML IJ SOLN
INTRAMUSCULAR | Status: AC
  Filled 2019-11-22: qty 1

## 2019-11-22 MED ORDER — SODIUM CHLORIDE 0.9 % IR SOLN
Status: DC | PRN
  Administered 2019-11-22: 3000 mL via INTRAVESICAL

## 2019-11-22 SURGICAL SUPPLY — 30 items
BAG DRAIN URO TABLE W/ADPT NS (BAG) ×3 IMPLANT
BAG DRN 8 ADPR NS SKTRN CSTL (BAG) ×1
BAG DRN RND TRDRP ANRFLXCHMBR (UROLOGICAL SUPPLIES) ×1
BAG HAMPER (MISCELLANEOUS) ×3 IMPLANT
BAG URINE DRAIN 2000ML AR STRL (UROLOGICAL SUPPLIES) ×3 IMPLANT
CATH FOLEY 3WAY 30CC 22F (CATHETERS) IMPLANT
CATH FOLEY LATEX FREE 22FR (CATHETERS)
CATH FOLEY LF 22FR (CATHETERS) IMPLANT
CATH INTERMIT  6FR 70CM (CATHETERS) ×3 IMPLANT
CLOTH BEACON ORANGE TIMEOUT ST (SAFETY) ×3 IMPLANT
DECANTER SPIKE VIAL GLASS SM (MISCELLANEOUS) ×3 IMPLANT
ELECT LOOP 22F BIPOLAR SML (ELECTROSURGICAL) ×3
ELECT REM PT RETURN 9FT ADLT (ELECTROSURGICAL) ×3
ELECTRODE LOOP 22F BIPOLAR SML (ELECTROSURGICAL) ×1 IMPLANT
ELECTRODE REM PT RTRN 9FT ADLT (ELECTROSURGICAL) ×1 IMPLANT
GLOVE BIO SURGEON STRL SZ8 (GLOVE) ×3 IMPLANT
GLOVE BIOGEL PI IND STRL 7.0 (GLOVE) ×2 IMPLANT
GLOVE BIOGEL PI INDICATOR 7.0 (GLOVE) ×6
GOWN STRL REUS W/TWL LRG LVL3 (GOWN DISPOSABLE) ×3 IMPLANT
GOWN STRL REUS W/TWL XL LVL3 (GOWN DISPOSABLE) ×3 IMPLANT
IV NS IRRIG 3000ML ARTHROMATIC (IV SOLUTION) ×3 IMPLANT
KIT TURNOVER CYSTO (KITS) ×3 IMPLANT
MANIFOLD NEPTUNE II (INSTRUMENTS) ×3 IMPLANT
PAD ARMBOARD 7.5X6 YLW CONV (MISCELLANEOUS) ×3 IMPLANT
PLUG CATH AND CAP STER (CATHETERS) ×3 IMPLANT
SYR 30ML LL (SYRINGE) ×3 IMPLANT
SYR TOOMEY 50ML (SYRINGE) ×3 IMPLANT
TOWEL OR 17X26 4PK STRL BLUE (TOWEL DISPOSABLE) ×3 IMPLANT
TRAY CYSTO PACK (CUSTOM PROCEDURE TRAY) ×3 IMPLANT
WATER STERILE IRR 500ML POUR (IV SOLUTION) ×3 IMPLANT

## 2019-11-22 NOTE — Transfer of Care (Signed)
Immediate Anesthesia Transfer of Care Note  Patient: IVELISSE BOGIN  Procedure(s) Performed: TRANSURETHRAL RESECTION OF BLADDER TUMOR (TURBT) (Bilateral ) CYSTOSCOPY WITH BILATERAL RETROGRADE PYELOGRAM (Bilateral )  Patient Location: PACU  Anesthesia Type:General  Level of Consciousness: awake, alert , oriented and patient cooperative  Airway & Oxygen Therapy: Patient Spontanous Breathing  Post-op Assessment: Report given to RN, Post -op Vital signs reviewed and stable and Patient moving all extremities X 4  Post vital signs: Reviewed and stable  Last Vitals:  Vitals Value Taken Time  BP    Temp    Pulse    Resp    SpO2      Last Pain:  Vitals:   11/22/19 1030  PainSc: 0-No pain         Complications: No apparent anesthesia complications

## 2019-11-22 NOTE — Interval H&P Note (Signed)
History and Physical Interval Note:  11/22/2019 11:17 AM  Peggy Hines Guard  has presented today for surgery, with the diagnosis of bladder tumor.  The various methods of treatment have been discussed with the patient and family. After consideration of risks, benefits and other options for treatment, the patient has consented to  Procedure(s): TRANSURETHRAL RESECTION OF BLADDER TUMOR (TURBT) (Bilateral) CYSTOSCOPY WITH RETROGRADE PYELOGRAM (Bilateral) as a surgical intervention.  The patient's history has been reviewed, patient examined, no change in status, stable for surgery.  I have reviewed the patient's chart and labs.  Questions were answered to the patient's satisfaction.     [name]

## 2019-11-22 NOTE — Anesthesia Procedure Notes (Signed)
Procedure Name: Intubation Date/Time: 11/22/2019 11:55 AM Performed by: Ollen Bowl, CRNA Pre-anesthesia Checklist: Patient identified, Patient being monitored, Timeout performed, Emergency Drugs available and Suction available Patient Re-evaluated:Patient Re-evaluated prior to induction Oxygen Delivery Method: Circle system utilized Preoxygenation: Pre-oxygenation with 100% oxygen Induction Type: IV induction Ventilation: Mask ventilation without difficulty Laryngoscope Size: Mac and 3 Grade View: Grade I Tube type: Oral Tube size: 7.0 mm Number of attempts: 1 Airway Equipment and Method: Stylet Placement Confirmation: ETT inserted through vocal cords under direct vision,  positive ETCO2 and breath sounds checked- equal and bilateral Secured at: 21 cm Tube secured with: Tape Dental Injury: Teeth and Oropharynx as per pre-operative assessment

## 2019-11-22 NOTE — Anesthesia Postprocedure Evaluation (Signed)
Anesthesia Post Note  Patient: Peggy Hines  Procedure(s) Performed: TRANSURETHRAL RESECTION OF BLADDER TUMOR (TURBT) (Bilateral ) CYSTOSCOPY WITH BILATERAL RETROGRADE PYELOGRAM (Bilateral )  Patient location during evaluation: PACU Anesthesia Type: General Level of consciousness: awake, oriented, awake and alert and patient cooperative Pain management: pain level controlled Vital Signs Assessment: post-procedure vital signs reviewed and stable Respiratory status: spontaneous breathing, nonlabored ventilation and respiratory function stable Cardiovascular status: stable Postop Assessment: no apparent nausea or vomiting Anesthetic complications: no     Last Vitals:  Vitals:   11/22/19 1030 11/22/19 1045  BP: (!) 170/88   Pulse: 96   Resp: 11 16  Temp: 36.7 C   SpO2: 97% 97%    Last Pain:  Vitals:   11/22/19 1030  PainSc: 0-No pain                 GREGORY,SUZANNE

## 2019-11-22 NOTE — Op Note (Signed)
.  Preoperative diagnosis: bladder tumor  Postoperative diagnosis: Same  Procedure: 1 cystoscopy 2. bilateral retrograde pyelography 3.  Intraoperative fluoroscopy, under one hour, with interpretation 4. 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  Ureteral orifices in normal anatomic location. No hydronephrosis or filling defects in either collecting system  Indications: Patient is a 69 year old female with a history of bladder tumor and gross hematuria.  After discussing treatment options, they decided proceed with transurethral resection of a bladder tumor.  Procedure her in detail: The patient was brought to the operating room and a brief timeout was done to ensure correct patient, correct procedure, correct site.  General anesthesia was administered patient was placed in dorsal lithotomy position.  Their genitalia was then prepped and draped in usual sterile fashion.  A rigid 30 French cystoscope was passed in the urethra and the bladder.  Bladder was inspected and we noted a 3cm bladder tumor.  the ureteral orifices were in the normal orthotopic locations.  a 6 french ureteral catheter was then instilled into the left ureteral orifice.  a gentle retrograde was obtained and findings noted above. We then turned our attention to the right side. a 6 french ureteral catheter was then instilled into the right ureteral orifice.  a gentle retrograde was obtained and findings noted above. We then removed the cystoscope and placed a resectoscope into the bladder. We proceeded to remove the large clot burden from the bladder. Once this was complete we turned our attention to the bladder tumor. Using the bipolar resectoscope we removed the bladder tumor  down to the base. A subsequent muscle deep biopsy was then taken. Hemostasis was then obtained with electrocautery. We then removed the bladder tumor chips and sent them for pathology. We then re-inspected the bladder and found no residula bleeding.  the bladder was then drained, a 22 French foley was placed and this concluded the procedure which was well tolerated by patient.  Complications: None  Condition: Stable, extubated, transferred to PACU  Plan: Patient is to be discharged home and followup in 5 days for foley catheter removal and pathology discussion.

## 2019-11-22 NOTE — Discharge Instructions (Signed)
Indwelling Urinary Catheter Care, Adult An indwelling urinary catheter is a thin, flexible, germ-free (sterile) tube that is placed into the bladder to help drain urine out of the body. The catheter is inserted into the part of the body that drains urine from the bladder (urethra). Urine drains from the catheter into a drainage bag outside of the body. Taking good care of your catheter will keep it working properly and help to prevent problems from developing. What are the risks?  Bacteria may get into your bladder and cause a urinary tract infection.  Urine flow can become blocked. This can happen if the catheter is not working correctly, or if you have sediment or a blood clot in your bladder or the catheter.  Tissue near the catheter may become irritated and bleed. How to wear your catheter and your drainage bag Supplies needed  Adhesive tape or a leg strap.  Alcohol wipe or soap and water (if you use tape).  A clean towel (if you use tape).  Overnight drainage bag.  Smaller drainage bag (leg bag). Wearing your catheter and bag Use adhesive tape or a leg strap to attach your catheter to your leg.  Make sure the catheter is not pulled tight.  If a leg strap gets wet, replace it with a dry one.  If you use adhesive tape: 1. Use an alcohol wipe or soap and water to wash off any stickiness on your skin where you had tape before. 2. Use a clean towel to pat-dry the area. 3. Apply the new tape. You should have received a large overnight drainage bag and a smaller leg bag that fits underneath clothing.  You may wear the overnight bag at any time, but you should not wear the leg bag at night.  Always wear the leg bag below your knee.  Make sure the overnight drainage bag is always lower than the level of your bladder, but do not let it touch the floor. Before you go to sleep, hang the bag inside a wastebasket that is covered by a clean plastic bag. How to care for your skin around  the catheter     Supplies needed  A clean washcloth.  Water and mild soap.  A clean towel. Caring for your skin and catheter  Every day, use a clean washcloth and soapy water to clean the skin around your catheter. 1. Wash your hands with soap and water. 2. Wet a washcloth in warm water and mild soap. 3. Clean the skin around your urethra.  If you are female:  Use one hand to gently spread the folds of skin around your vagina (labia).  With the washcloth in your other hand, wipe the inner side of your labia on each side. Do this in a front-to-back direction.  If you are female:  Use one hand to pull back any skin that covers the end of your penis (foreskin).  With the washcloth in your other hand, wipe your penis in small circles. Start wiping at the tip of your penis, then move outward from the catheter.  Move the foreskin back in place, if this applies. 4. With your free hand, hold the catheter close to where it enters your body. Keep holding the catheter during cleaning so it does not get pulled out. 5. Use your other hand to clean the catheter with the washcloth.  Only wipe downward on the catheter.  Do not wipe upward toward your body, because that may push bacteria into your urethra  and cause infection. 6. Use a clean towel to pat-dry the catheter and the skin around it. Make sure to wipe off all soap. 7. Wash your hands with soap and water.  Shower every day. Do not take baths.  Do not use cream, ointment, or lotion on the area where the catheter enters your body, unless your health care provider tells you to do that.  Do not use powders, sprays, or lotions on your genital area.  Check your skin around the catheter every day for signs of infection. Check for: ? Redness, swelling, or pain. ? Fluid or blood. ? Warmth. ? Pus or a bad smell. How to empty the drainage bag Supplies needed  Rubbing alcohol.  Gauze pad or cotton ball.  Adhesive tape or a leg  strap. Emptying the bag Empty your drainage bag (your overnight drainage bag or your leg bag) when it is ?- full, or at least 2-3 times a day. Clean the drainage bag according to the manufacturer's instructions or as told by your health care provider. 1. Wash your hands with soap and water. 2. Detach the drainage bag from your leg. 3. Hold the drainage bag over the toilet or a clean container. Make sure the drainage bag is lower than your hips and bladder. This stops urine from going back into the tubing and into your bladder. 4. Open the pour spout at the bottom of the bag. 5. Empty the urine into the toilet or container. Do not let the pour spout touch any surface. This precaution is important to prevent bacteria from getting in the bag and causing infection. 6. Apply rubbing alcohol to a gauze pad or cotton ball. 7. Use the gauze pad or cotton ball to clean the pour spout. 8. Close the pour spout. 9. Attach the bag to your leg with adhesive tape or a leg strap. 10. Wash your hands with soap and water. How to change the drainage bag Supplies needed:  Alcohol wipes.  A clean drainage bag.  Adhesive tape or a leg strap. Changing the bag Replace your drainage bag with a clean bag if it leaks, starts to smell bad, or looks dirty. 1. Wash your hands with soap and water. 2. Detach the dirty drainage bag from your leg. 3. Pinch the catheter with your fingers so that urine does not spill out. 4. Disconnect the catheter tube from the drainage tube at the connection valve. Do not let the tubes touch any surface. 5. Clean the end of the catheter tube with an alcohol wipe. Use a different alcohol wipe to clean the end of the drainage tube. 6. Connect the catheter tube to the drainage tube of the clean bag. 7. Attach the clean bag to your leg with adhesive tape or a leg strap. Avoid attaching the new bag too tightly. 8. Wash your hands with soap and water. General instructions   Never pull on  your catheter or try to remove it. Pulling can damage your internal tissues.  Always wash your hands before and after you handle your catheter or drainage bag. Use a mild, fragrance-free soap. If soap and water are not available, use hand sanitizer.  Always make sure there are no twists or bends (kinks) in the catheter tube.  Always make sure there are no leaks in the catheter or drainage bag.  Drink enough fluid to keep your urine pale yellow.  Do not take baths, swim, or use a hot tub.  If you are female, wipe from  front to back after having a bowel movement. Contact a health care provider if:  Your urine is cloudy.  Your urine smells unusually bad.  Your catheter gets clogged.  Your catheter starts to leak.  Your bladder feels full. Get help right away if:  You have redness, swelling, or pain where the catheter enters your body.  You have fluid, blood, pus, or a bad smell coming from the area where the catheter enters your body.  The area where the catheter enters your body feels warm to the touch.  You have a fever.  You have pain in your abdomen, legs, lower back, or bladder.  You see blood in the catheter.  Your urine is pink or red.  You have nausea, vomiting, or chills.  Your urine is not draining into the bag.  Your catheter gets pulled out. Summary  An indwelling urinary catheter is a thin, flexible, germ-free (sterile) tube that is placed into the bladder to help drain urine out of the body.  The catheter is inserted into the part of the body that drains urine from the bladder (urethra).  Take good care of your catheter to keep it working properly and help prevent problems from developing.  Always wash your hands before and after you handle your catheter or drainage bag.  Never pull on your catheter or try to remove it. This information is not intended to replace advice given to you by your health care provider. Make sure you discuss any questions  you have with your health care provider. Document Revised: 10/16/2018 Document Reviewed: 02/07/2017 Elsevier Patient Education  [place].     Transurethral Resection of Bladder Tumor, Care After This sheet gives you information about how to care for yourself after your procedure. Your health care provider may also give you more specific instructions. If you have problems or questions, contact your health care provider. What can I expect after the procedure? After the procedure, it is common to have:  A small amount of blood in your urine for up to 2 weeks.  Soreness or mild pain from your catheter. After your catheter is removed, you may have mild soreness, especially when urinating.  Pain in your lower abdomen. Follow these instructions at home: Medicines   Take over-the-counter and prescription medicines only as told by your health care provider.  If you were prescribed an antibiotic medicine, take it as told by your health care provider. Do not stop taking the antibiotic even if you start to feel better.  Do not drive for 24 hours if you were given a sedative during your procedure.  Ask your health care provider if the medicine prescribed to you: ? Requires you to avoid driving or using heavy machinery. ? Can cause constipation. You may need to take these actions to prevent or treat constipation:  Take over-the-counter or prescription medicines.  Eat foods that are high in fiber, such as beans, whole grains, and fresh fruits and vegetables.  Limit foods that are high in fat and processed sugars, such as fried or sweet foods. Activity  Return to your normal activities as told by your health care provider. Ask your health care provider what activities are safe for you.  Do not lift anything that is heavier than 10 lb (4.5 kg), or the limit that you are told, until your health care provider says that it is safe.  Avoid intense physical activity for as long as told  by your health care provider.  Rest  as told by your health care provider.  Avoid sitting for a long time without moving. Get up to take short walks every 1-2 hours. This is important to improve blood flow and breathing. Ask for help if you feel weak or unsteady. General instructions   Do not drink alcohol for as long as told by your health care provider. This is especially important if you are taking prescription pain medicines.  Do not take baths, swim, or use a hot tub until your health care provider approves. Ask your health care provider if you may take showers. You may only be allowed to take sponge baths.  If you have a catheter, follow instructions from your health care provider about caring for your catheter and your drainage bag.  Drink enough fluid to keep your urine pale yellow.  Wear compression stockings as told by your health care provider. These stockings help to prevent blood clots and reduce swelling in your legs.  Keep all follow-up visits as told by your health care provider. This is important. ? You will need to be followed closely with regular checks of your bladder and urethra (cystoscopies) to make sure that the cancer does not come back. Contact a health care provider if:  You have pain that gets worse or does not improve with medicine.  You have blood in your urine for more than 2 weeks.  You have cloudy or bad-smelling urine.  You become constipated. Signs of constipation may include having: ? Fewer than three bowel movements in a week. ? Difficulty having a bowel movement. ? Stools that are dry, hard, or larger than normal.  You have a fever. Get help right away if:  You have: ? Severe pain. ? Bright red blood in your urine. ? Blood clots in your urine. ? A lot of blood in your urine.  Your catheter has been removed and you are not able to urinate.  You have a catheter in place and the catheter is not draining urine. Summary  After your  procedure, it is common to have a small amount of blood in your urine, soreness or mild pain from your catheter, and pain in your lower abdomen.  Take over-the-counter and prescription medicines only as told by your health care provider.  Rest as told by your health care provider. Follow your health care provider's instructions about returning to normal activities. Ask what activities are safe for you.  If you have a catheter, follow instructions from your health care provider about caring for your catheter and your drainage bag.  Get help right away if you cannot urinate, you have severe pain, or you have bright red blood or blood clots in your urine. This information is not intended to replace advice given to you by your health care provider. Make sure you discuss any questions you have with your health care provider. Document Revised: 01/22/2018 Document Reviewed: 01/22/2018 Elsevier Patient Education  [place].

## 2019-11-22 NOTE — Anesthesia Preprocedure Evaluation (Signed)
Anesthesia Evaluation  Patient identified by MRN, date of birth, ID band Patient awake    Reviewed: Allergy & Precautions, H&P , NPO status , Patient's Chart, lab work & pertinent test results, reviewed documented beta blocker date and time   History of Anesthesia Complications (+) PONV  Airway Mallampati: II  TM Distance: >3 FB Neck ROM: full    Dental no notable dental hx. (+) Teeth Intact   Pulmonary neg pulmonary ROS,    Pulmonary exam normal breath sounds clear to auscultation       Cardiovascular Exercise Tolerance: Good negative cardio ROS   Rhythm:regular Rate:Normal     Neuro/Psych negative neurological ROS  negative psych ROS   GI/Hepatic Neg liver ROS, GERD  Medicated,  Endo/Other  negative endocrine ROS  Renal/GU negative Renal ROS  negative genitourinary   Musculoskeletal   Abdominal   Peds  Hematology negative hematology ROS (+)   Anesthesia Other Findings   Reproductive/Obstetrics negative OB ROS                             Anesthesia Physical Anesthesia Plan  ASA: II  Anesthesia Plan: General   Post-op Pain Management:    Induction:   PONV Risk Score and Plan: 3 and Ondansetron and Scopolamine patch - Pre-op  Airway Management Planned:   Additional Equipment:   Intra-op Plan:   Post-operative Plan:   Informed Consent: I have reviewed the patients History and Physical, chart, labs and discussed the procedure including the risks, benefits and alternatives for the proposed anesthesia with the patient or authorized representative who has indicated his/her understanding and acceptance.     Dental Advisory Given  Plan Discussed with: CRNA  Anesthesia Plan Comments:         Anesthesia Quick Evaluation

## 2019-11-23 LAB — SURGICAL PATHOLOGY

## 2019-11-26 ENCOUNTER — Telehealth: Payer: Self-pay | Admitting: Urology

## 2019-11-26 NOTE — Telephone Encounter (Signed)
I returned pt call. Pt reports that she noticed a smell from her catheter insertion site. Pt reports after she took a shower the smell went away. Denies any foul smell to urine or fever. Pt scheduled for post op on Monday 5/24. Pt instructed to keep appt.

## 2019-11-26 NOTE — Telephone Encounter (Signed)
Pt left a vm stating she things the area around her cath is infected. She would like to get a phone call back. [phone] or [phone]

## 2019-11-29 ENCOUNTER — Ambulatory Visit (INDEPENDENT_AMBULATORY_CARE_PROVIDER_SITE_OTHER): Payer: Medicare PPO | Admitting: Urology

## 2019-11-29 ENCOUNTER — Other Ambulatory Visit: Payer: Self-pay

## 2019-11-29 ENCOUNTER — Encounter: Payer: Self-pay | Admitting: Urology

## 2019-11-29 VITALS — BP 155/81 | HR 92 | Temp 97.5°F | Ht 68.0 in | Wt 200.0 lb

## 2019-11-29 DIAGNOSIS — C672 Malignant neoplasm of lateral wall of bladder: Secondary | ICD-10-CM | POA: Diagnosis not present

## 2019-11-29 NOTE — H&P (View-Only) (Signed)
11/29/2019 11:47 AM   Peggy Hines [birth year] [id number]  Referring provider: Sharilyn Sites, MD [address] [place],  [place] [address]  followup after bladder tumor resection  HPI: Ms Risk is a [phone] here for followup after bladder tumor resection. Pathology was T1G3 bladder cancer with muscle in specimen. She passed her voiding trial. No pain currently. Urine was light pink this morning   PMH: Past Medical History:  Diagnosis Date  . Acid reflux   . Hyperlipidemia   . Mucous membrane pemphigoid   . Mucous membrane pemphigoid   . PONV (postoperative nausea and vomiting)    pt requests zofran before surgery.    Surgical History: Past Surgical History:  Procedure Laterality Date  . ABDOMINAL HYSTERECTOMY    . APPENDECTOMY    . CHOLECYSTECTOMY    . CYSTOSCOPY W/ RETROGRADES Bilateral 11/22/2019   Procedure: CYSTOSCOPY WITH BILATERAL RETROGRADE PYELOGRAM;  Surgeon: Peggy Gustin, MD;  Location: [facility];  Service: Urology;  Laterality: Bilateral;  . SPINAL FUSION  2017   C 5/6 C 6/7  . TRANSURETHRAL RESECTION OF BLADDER TUMOR Bilateral 11/22/2019   Procedure: TRANSURETHRAL RESECTION OF BLADDER TUMOR (TURBT);  Surgeon: Peggy Gustin, MD;  Location: [facility];  Service: Urology;  Laterality: Bilateral;    Home Medications:  Allergies as of 11/29/2019      Reactions   Peggy Hines  [oseltamivir] Nausea And Vomiting      Medication List       Accurate as of Nov 29, 2019 11:47 AM. If you have any questions, ask your nurse or doctor.        acetaminophen 500 MG tablet Commonly known as: TYLENOL Take 500-1,000 mg by mouth every 6 (six) hours as needed (headaches/pain.).   aspirin EC 81 MG tablet Take 81 mg by mouth every evening.   atorvastatin 20 MG tablet Commonly known as: LIPITOR Take 20 mg by mouth every evening.   estradiol 0.05 MG/24HR patch Commonly known as: VIVELLE-DOT Place 0.5 patches onto the skin once a week.   famotidine 20 MG  tablet Commonly known as: PEPCID Take 20 mg by mouth at bedtime.   Fish Oil 1000 MG Caps Take 2,000 mg by mouth daily.   oxyCODONE-acetaminophen 5-325 MG tablet Commonly known as: Percocet Take 1 tablet by mouth every 4 (four) hours as needed for severe pain.   PRESERVISION AREDS 2 PO Take 1 tablet by mouth at bedtime.   promethazine 25 MG suppository Commonly known as: Phenergan Place 1 suppository (25 mg total) rectally every 6 (six) hours as needed for nausea.   RITUXAN IV Inject 1 Dose into the vein every 6 (six) months.   Uribel 118 MG Caps Take 1 capsule (118 mg total) by mouth in the morning, at noon, and at bedtime. What changed:   when to take this  reasons to take this   Vitamin D3 50 MCG (2000 UT) Tabs Take 2,000 Units by mouth every evening.       Allergies:  Allergies  Allergen Reactions  . Peggy Hines  [Oseltamivir] Nausea And Vomiting    Family History: Family History  Problem Relation Age of Onset  . Breast cancer Sister 53  . Stroke Father   . Stroke Mother     Social History:  reports that she has never smoked. She has never used smokeless tobacco. She reports that she does not drink alcohol or use drugs.  ROS: All other review of systems were reviewed and are negative except what is  noted above in HPI  Physical Exam: BP (!) 155/81   Pulse 92   Temp (!) 97.5 F (36.4 C)   Ht 5\' 8"  (1.727 m)   Wt 200 lb (90.7 kg)   BMI 30.41 kg/m   Constitutional:  Alert and oriented, No acute distress. HEENT: [place] AT, moist mucus membranes.  Trachea midline, no masses. Cardiovascular: No clubbing, cyanosis, or edema. Respiratory: Normal respiratory effort, no increased work of breathing. GI: Abdomen is soft, nontender, nondistended, no abdominal masses GU: No CVA tenderness.  Lymph: No cervical or inguinal lymphadenopathy. Skin: No rashes, bruises or suspicious lesions. Neurologic: Grossly intact, no focal deficits, moving all 4  extremities. Psychiatric: Normal mood and affect.  Laboratory Data: No results found for: WBC, HGB, HCT, MCV, PLT  Lab Results  Component Value Date   CREATININE 1.17 (H) 09/15/2019    No results found for: PSA  No results found for: TESTOSTERONE  No results found for: HGBA1C  Urinalysis    Component Value Date/Time   BILIRUBINUR neg 10/27/2019 1037   PROTEINUR Positive (A) 10/27/2019 1037   UROBILINOGEN 0.2 10/27/2019 1037   NITRITE neg 10/27/2019 1037   LEUKOCYTESUR Negative 10/27/2019 1037    No results found for: LABMICR, WBCUA, RBCUA, LABEPIT, MUCUS, BACTERIA  Pertinent Imaging:  No results found for this or any previous visit. No results found for this or any previous visit. No results found for this or any previous visit. No results found for this or any previous visit. No results found for this or any previous visit. No results found for this or any previous visit. Results for orders placed during the hospital encounter of 10/18/19  CT HEMATURIA WORKUP   Narrative CLINICAL DATA:  Gross hematuria since 12/26/2018, UTI symptoms.  EXAM: CT ABDOMEN AND PELVIS WITHOUT AND WITH CONTRAST  TECHNIQUE: Multidetector CT imaging of the abdomen and pelvis was performed following the standard protocol before and following the bolus administration of intravenous contrast.  CONTRAST:  125 cc Omnipaque 300.  COMPARISON:  CT chest 03/03/2018.  FINDINGS: Lower chest: 2 mm peripheral left lower lobe nodule (6/15), likely benign. Lung bases are otherwise clear. Heart size normal. No pericardial or pleural effusion. Distal esophagus is unremarkable.  Hepatobiliary: Liver is slightly decreased in attenuation diffusely. Cholecystectomy. No biliary ductal dilatation.  Pancreas: Negative.  Spleen: Negative.  Adrenals/Urinary Tract: Adrenal glands are unremarkable. No urinary stones or obstruction. Low-attenuation lesions in the kidneys measure up to 9 mm on the right  and are likely cysts. No filling defects in the intrarenal collecting systems, ureters or opacified portion of the bladder.  Stomach/Bowel: Stomach, small bowel and colon are unremarkable. Appendix is not readily visualized.  Vascular/Lymphatic: Atherosclerotic calcification of the aorta without aneurysm. No pathologically enlarged lymph nodes.  Reproductive: Hysterectomy.  No adnexal mass.  Other: No free fluid.  Mesenteries and peritoneum are unremarkable.  Musculoskeletal: Degenerative changes in the spine. No worrisome lytic or sclerotic lesions.  IMPRESSION: 1. No findings to explain the patient's symptoms. 2. Hepatic steatosis. 3.  Aortic atherosclerosis (ICD10-I70.0).   Electronically Signed   By: Lorin Picket M.D.   On: 10/18/2019 10:15    No results found for this or any previous visit.  Assessment & Plan:    1. Malignant neoplasm of lateral wall of urinary bladder (HCC) -We discussed the management to T1G3 bladder cancer including surveillance, repeat resection following 6 weeks of BCG, mitomycin instillation, and radical cystectomy.    No follow-ups on file.  [name], MD  [facility] [place]

## 2019-11-29 NOTE — Patient Instructions (Signed)
Bladder Cancer  Bladder cancer is an abnormal growth of tissue in the bladder. The bladder is the balloon-like sac in the pelvis. It collects and stores urine that comes from the kidneys through the ureters. The bladder wall is made of layers. If cancer spreads into these layers and through the wall of the bladder, it becomes more difficult to treat. What are the causes? The cause of this condition is not known. What increases the risk? The following factors may make you more likely to develop this condition:  Smoking.  Workplace risks (occupational exposures), such as rubber, leather, textile, dyes, chemicals, and paint.  Being white.  Your age. Most people with bladder cancer are over the age of 55.  Being female.  Having chronic bladder inflammation.  Having a personal history of bladder cancer.  Having a family history of bladder cancer (heredity).  Having had chemotherapy or radiation therapy to the pelvis.  Having been exposed to arsenic. What are the signs or symptoms? Initial symptoms of this condition include:  Blood in the urine.  Painful urination.  Frequent bladder or urine infections.  Increase in urgency and frequency of urination. Advanced symptoms of this condition include:  Not being able to urinate.  Low back pain on one side.  Loss of appetite.  Weight loss.  Fatigue.  Swelling in the feet.  Bone pain. How is this diagnosed? This condition is diagnosed based on your medical history, a physical exam, urine tests, lab tests, imaging tests, and your symptoms. You may also have other tests or procedures done, such as:  A narrow tube being inserted into your bladder through your urethra (cystoscopy) in order to view the lining of your bladder for tumors.  A biopsy to sample the tumor to see if cancer is present. If cancer is present, it will then be staged to determine its severity and extent. Staging is an assessment of:  The size of the  tumor.  Whether the cancer has spread.  Where the cancer has spread. It is important to know how deeply into the bladder wall cancer has grown and whether cancer has spread to any other parts of your body. Staging may require blood tests or imaging tests, such as a CT scan, MRI, bone scan, or chest X-ray. How is this treated? Based on the stage of cancer, one treatment or a combination of treatments may be recommended. The most common forms of treatment are:  Surgery to remove the cancer. Procedures that may be done include transurethral resection and cystectomy.  Radiation therapy. This is high-energy X-rays or other particles. This is often used in combination with chemotherapy.  Chemotherapy. During this treatment, medicines are used to kill cancer cells.  Immunotherapy. This uses medicines to help your own immune system destroy cancer cells. Follow these instructions at home:  Take over-the-counter and prescription medicines only as told by your health care provider.  Maintain a healthy diet. Some of your treatments might affect your appetite.  Consider joining a support group. This may help you learn to cope with the stress of having bladder cancer.  Tell your cancer care team if you develop side effects. They may be able to recommend ways to relieve them.  Keep all follow-up visits as told by your health care provider. This is important. Where to find more information  American Cancer Society: www.cancer.org  National Cancer Institute (NCI): www.cancer.gov Contact a health care provider if:  You have symptoms of a urinary tract infection. These include: ?   Fever. ? Chills. ? Weakness. ? Muscle aches. ? Abdominal pain. ? Frequent and intense urge to urinate. ? Burning feeling in the bladder or urethra during urination. Get help right away if:  There is blood in your urine.  You cannot urinate.  You have severe pain or other symptoms that do not go  away. Summary  Bladder cancer is an abnormal growth of tissue in the bladder.  This condition is diagnosed based on your medical history, a physical exam, urine tests, lab tests, imaging tests, and your symptoms.  Based on the stage of cancer, surgery, chemotherapy, or a combination of treatments may be recommended.  Consider joining a support group. This may help you learn to cope with the stress of having bladder cancer. This information is not intended to replace advice given to you by your health care provider. Make sure you discuss any questions you have with your health care provider. Document Revised: 06/06/2017 Document Reviewed: 05/28/2016 Elsevier Patient Education  2020 [employer]  

## 2019-11-29 NOTE — Progress Notes (Signed)
Urological Symptom Review  Patient is experiencing the following symptoms: none   Review of Systems  Gastrointestinal (upper)  : Negative for upper GI symptoms  Gastrointestinal (lower) : Negative for lower GI symptoms  Constitutional : Negative for symptoms  Skin: Negative for skin symptoms  Eyes: Negative for eye symptoms  Ear/Nose/Throat : Negative for Ear/Nose/Throat symptoms  Hematologic/Lymphatic: Negative for Hematologic/Lymphatic symptoms  Cardiovascular : Negative for cardiovascular symptoms  Respiratory : Negative for respiratory symptoms  Endocrine: Negative for endocrine symptoms  Musculoskeletal: Negative for musculoskeletal symptoms  Neurological: Negative for neurological symptoms  Psychologic: Negative for psychiatric symptoms   Fill and Pull Catheter Removal  Patient is present today for a catheter removal.  Patient was cleaned and prepped in a sterile fashion 148ml of sterile water/ saline was instilled into the bladder when the patient felt the urge to urinate. 28ml of water was then drained from the balloon.  A 16FR foley cath was removed from the bladder no complications were noted .  Patient was then given some time to void on their own.  Patient can void  147ml on their own after some time.  Patient tolerated well.  Performed by: Casimir Barcellos LPN  Follow up/ Additional notes: per MD note

## 2019-11-29 NOTE — Progress Notes (Signed)
11/29/2019 11:47 AM   [name] 27-Jun-1951 GP:[id number]  Referring provider: Sharilyn Sites, MD [address] [place],  [place] [address]  followup after bladder tumor resection  HPI: Ms Hargreaves is a [phone] here for followup after bladder tumor resection. Pathology was T1G3 bladder cancer with muscle in specimen. She passed her voiding trial. No pain currently. Urine was light pink this morning   PMH: Past Medical History:  Diagnosis Date  . Acid reflux   . Hyperlipidemia   . Mucous membrane pemphigoid   . Mucous membrane pemphigoid   . PONV (postoperative nausea and vomiting)    pt requests zofran before surgery.    Surgical History: Past Surgical History:  Procedure Laterality Date  . ABDOMINAL HYSTERECTOMY    . APPENDECTOMY    . CHOLECYSTECTOMY    . CYSTOSCOPY W/ RETROGRADES Bilateral 11/22/2019   Procedure: CYSTOSCOPY WITH BILATERAL RETROGRADE PYELOGRAM;  Surgeon: Cleon Gustin, MD;  Location: [facility];  Service: Urology;  Laterality: Bilateral;  . SPINAL FUSION  2017   C 5/6 C 6/7  . TRANSURETHRAL RESECTION OF BLADDER TUMOR Bilateral 11/22/2019   Procedure: TRANSURETHRAL RESECTION OF BLADDER TUMOR (TURBT);  Surgeon: Cleon Gustin, MD;  Location: [facility];  Service: Urology;  Laterality: Bilateral;    Home Medications:  Allergies as of 11/29/2019      Reactions   Tamiflu  [oseltamivir] Nausea And Vomiting      Medication List       Accurate as of Nov 29, 2019 11:47 AM. If you have any questions, ask your nurse or doctor.        acetaminophen 500 MG tablet Commonly known as: TYLENOL Take 500-1,000 mg by mouth every 6 (six) hours as needed (headaches/pain.).   aspirin EC 81 MG tablet Take 81 mg by mouth every evening.   atorvastatin 20 MG tablet Commonly known as: LIPITOR Take 20 mg by mouth every evening.   estradiol 0.05 MG/24HR patch Commonly known as: VIVELLE-DOT Place 0.5 patches onto the skin once a week.   famotidine 20 MG  tablet Commonly known as: PEPCID Take 20 mg by mouth at bedtime.   Fish Oil 1000 MG Caps Take 2,000 mg by mouth daily.   oxyCODONE-acetaminophen 5-325 MG tablet Commonly known as: Percocet Take 1 tablet by mouth every 4 (four) hours as needed for severe pain.   PRESERVISION AREDS 2 PO Take 1 tablet by mouth at bedtime.   promethazine 25 MG suppository Commonly known as: Phenergan Place 1 suppository (25 mg total) rectally every 6 (six) hours as needed for nausea.   RITUXAN IV Inject 1 Dose into the vein every 6 (six) months.   Uribel 118 MG Caps Take 1 capsule (118 mg total) by mouth in the morning, at noon, and at bedtime. What changed:   when to take this  reasons to take this   Vitamin D3 50 MCG (2000 UT) Tabs Take 2,000 Units by mouth every evening.       Allergies:  Allergies  Allergen Reactions  . Tamiflu  [Oseltamivir] Nausea And Vomiting    Family History: Family History  Problem Relation Age of Onset  . Breast cancer Sister 34  . Stroke Father   . Stroke Mother     Social History:  reports that she has never smoked. She has never used smokeless tobacco. She reports that she does not drink alcohol or use drugs.  ROS: All other review of systems were reviewed and are negative except what is  noted above in HPI  Physical Exam: BP (!) 155/81   Pulse 92   Temp (!) 97.5 F (36.4 C)   Ht 5\' 8"  (1.727 m)   Wt 200 lb (90.7 kg)   BMI 30.41 kg/m   Constitutional:  Alert and oriented, No acute distress. HEENT: [place] AT, moist mucus membranes.  Trachea midline, no masses. Cardiovascular: No clubbing, cyanosis, or edema. Respiratory: Normal respiratory effort, no increased work of breathing. GI: Abdomen is soft, nontender, nondistended, no abdominal masses GU: No CVA tenderness.  Lymph: No cervical or inguinal lymphadenopathy. Skin: No rashes, bruises or suspicious lesions. Neurologic: Grossly intact, no focal deficits, moving all 4  extremities. Psychiatric: Normal mood and affect.  Laboratory Data: No results found for: WBC, HGB, HCT, MCV, PLT  Lab Results  Component Value Date   CREATININE 1.17 (H) 09/15/2019    No results found for: PSA  No results found for: TESTOSTERONE  No results found for: HGBA1C  Urinalysis    Component Value Date/Time   BILIRUBINUR neg 10/27/2019 1037   PROTEINUR Positive (A) 10/27/2019 1037   UROBILINOGEN 0.2 10/27/2019 1037   NITRITE neg 10/27/2019 1037   LEUKOCYTESUR Negative 10/27/2019 1037    No results found for: LABMICR, WBCUA, RBCUA, LABEPIT, MUCUS, BACTERIA  Pertinent Imaging:  No results found for this or any previous visit. No results found for this or any previous visit. No results found for this or any previous visit. No results found for this or any previous visit. No results found for this or any previous visit. No results found for this or any previous visit. Results for orders placed during the hospital encounter of 10/18/19  CT HEMATURIA WORKUP   Narrative CLINICAL DATA:  Gross hematuria since 12/26/2018, UTI symptoms.  EXAM: CT ABDOMEN AND PELVIS WITHOUT AND WITH CONTRAST  TECHNIQUE: Multidetector CT imaging of the abdomen and pelvis was performed following the standard protocol before and following the bolus administration of intravenous contrast.  CONTRAST:  125 cc Omnipaque 300.  COMPARISON:  CT chest 03/03/2018.  FINDINGS: Lower chest: 2 mm peripheral left lower lobe nodule (6/15), likely benign. Lung bases are otherwise clear. Heart size normal. No pericardial or pleural effusion. Distal esophagus is unremarkable.  Hepatobiliary: Liver is slightly decreased in attenuation diffusely. Cholecystectomy. No biliary ductal dilatation.  Pancreas: Negative.  Spleen: Negative.  Adrenals/Urinary Tract: Adrenal glands are unremarkable. No urinary stones or obstruction. Low-attenuation lesions in the kidneys measure up to 9 mm on the right  and are likely cysts. No filling defects in the intrarenal collecting systems, ureters or opacified portion of the bladder.  Stomach/Bowel: Stomach, small bowel and colon are unremarkable. Appendix is not readily visualized.  Vascular/Lymphatic: Atherosclerotic calcification of the aorta without aneurysm. No pathologically enlarged lymph nodes.  Reproductive: Hysterectomy.  No adnexal mass.  Other: No free fluid.  Mesenteries and peritoneum are unremarkable.  Musculoskeletal: Degenerative changes in the spine. No worrisome lytic or sclerotic lesions.  IMPRESSION: 1. No findings to explain the patient's symptoms. 2. Hepatic steatosis. 3.  Aortic atherosclerosis (ICD10-I70.0).   Electronically Signed   By: Lorin Picket M.D.   On: 10/18/2019 10:15    No results found for this or any previous visit.  Assessment & Plan:    1. Malignant neoplasm of lateral wall of urinary bladder (HCC) -We discussed the management to T1G3 bladder cancer including surveillance, repeat resection following 6 weeks of BCG, mitomycin instillation, and radical cystectomy.    No follow-ups on file.  [name], MD  [facility] [place]

## 2019-12-01 ENCOUNTER — Ambulatory Visit: Payer: Medicare PPO | Admitting: Urology

## 2019-12-23 DIAGNOSIS — Z6831 Body mass index (BMI) 31.0-31.9, adult: Secondary | ICD-10-CM | POA: Diagnosis not present

## 2019-12-23 DIAGNOSIS — E7849 Other hyperlipidemia: Secondary | ICD-10-CM | POA: Diagnosis not present

## 2019-12-23 DIAGNOSIS — Z1389 Encounter for screening for other disorder: Secondary | ICD-10-CM | POA: Diagnosis not present

## 2019-12-23 DIAGNOSIS — Z0000 Encounter for general adult medical examination without abnormal findings: Secondary | ICD-10-CM | POA: Diagnosis not present

## 2019-12-23 DIAGNOSIS — C679 Malignant neoplasm of bladder, unspecified: Secondary | ICD-10-CM | POA: Diagnosis not present

## 2019-12-23 DIAGNOSIS — R7309 Other abnormal glucose: Secondary | ICD-10-CM | POA: Diagnosis not present

## 2019-12-24 DIAGNOSIS — Z0000 Encounter for general adult medical examination without abnormal findings: Secondary | ICD-10-CM | POA: Diagnosis not present

## 2019-12-24 DIAGNOSIS — L121 Cicatricial pemphigoid: Secondary | ICD-10-CM | POA: Diagnosis not present

## 2019-12-24 DIAGNOSIS — Z1389 Encounter for screening for other disorder: Secondary | ICD-10-CM | POA: Diagnosis not present

## 2019-12-24 DIAGNOSIS — E6609 Other obesity due to excess calories: Secondary | ICD-10-CM | POA: Diagnosis not present

## 2019-12-24 DIAGNOSIS — E039 Hypothyroidism, unspecified: Secondary | ICD-10-CM | POA: Diagnosis not present

## 2019-12-27 ENCOUNTER — Other Ambulatory Visit: Payer: Self-pay

## 2019-12-27 ENCOUNTER — Encounter (HOSPITAL_COMMUNITY)
Admission: RE | Admit: 2019-12-27 | Discharge: 2019-12-27 | Disposition: A | Discharge status: Home / Self Care | Payer: Medicare PPO | Source: Ambulatory Visit | Attending: Urology | Admitting: Urology

## 2019-12-27 ENCOUNTER — Other Ambulatory Visit (HOSPITAL_COMMUNITY)
Admission: RE | Admit: 2019-12-27 | Discharge: 2019-12-27 | Disposition: A | Discharge status: Home / Self Care | Payer: Medicare PPO | Source: Ambulatory Visit | Attending: Urology | Admitting: Urology

## 2019-12-27 DIAGNOSIS — Z01812 Encounter for preprocedural laboratory examination: Secondary | ICD-10-CM | POA: Diagnosis not present

## 2019-12-27 DIAGNOSIS — Z20822 Contact with and (suspected) exposure to covid-19: Secondary | ICD-10-CM | POA: Insufficient documentation

## 2019-12-28 LAB — SARS CORONAVIRUS 2 (TAT 6-24 HRS): SARS Coronavirus 2: NEGATIVE

## 2019-12-29 ENCOUNTER — Encounter (HOSPITAL_COMMUNITY)
Admission: RE | Disposition: A | Discharge status: Home / Self Care | Payer: Self-pay | Source: Home / Self Care | Attending: Urology

## 2019-12-29 ENCOUNTER — Other Ambulatory Visit: Payer: Self-pay

## 2019-12-29 ENCOUNTER — Ambulatory Visit (HOSPITAL_COMMUNITY): Payer: Medicare PPO | Admitting: Anesthesiology

## 2019-12-29 ENCOUNTER — Ambulatory Visit (HOSPITAL_COMMUNITY)
Admission: RE | Admit: 2019-12-29 | Discharge: 2019-12-29 | Disposition: A | Discharge status: Home / Self Care | Payer: Medicare PPO | Attending: Urology | Admitting: Urology

## 2019-12-29 ENCOUNTER — Encounter (HOSPITAL_COMMUNITY): Payer: Self-pay | Admitting: Urology

## 2019-12-29 DIAGNOSIS — D090 Carcinoma in situ of bladder: Secondary | ICD-10-CM | POA: Diagnosis not present

## 2019-12-29 DIAGNOSIS — Z981 Arthrodesis status: Secondary | ICD-10-CM | POA: Diagnosis not present

## 2019-12-29 DIAGNOSIS — Z9049 Acquired absence of other specified parts of digestive tract: Secondary | ICD-10-CM | POA: Insufficient documentation

## 2019-12-29 DIAGNOSIS — C679 Malignant neoplasm of bladder, unspecified: Secondary | ICD-10-CM | POA: Diagnosis present

## 2019-12-29 DIAGNOSIS — K219 Gastro-esophageal reflux disease without esophagitis: Secondary | ICD-10-CM | POA: Diagnosis not present

## 2019-12-29 DIAGNOSIS — C672 Malignant neoplasm of lateral wall of bladder: Secondary | ICD-10-CM

## 2019-12-29 DIAGNOSIS — Z803 Family history of malignant neoplasm of breast: Secondary | ICD-10-CM | POA: Diagnosis not present

## 2019-12-29 DIAGNOSIS — Z823 Family history of stroke: Secondary | ICD-10-CM | POA: Diagnosis not present

## 2019-12-29 DIAGNOSIS — Z887 Allergy status to serum and vaccine status: Secondary | ICD-10-CM | POA: Insufficient documentation

## 2019-12-29 DIAGNOSIS — D494 Neoplasm of unspecified behavior of bladder: Secondary | ICD-10-CM | POA: Diagnosis not present

## 2019-12-29 DIAGNOSIS — N3289 Other specified disorders of bladder: Secondary | ICD-10-CM | POA: Diagnosis not present

## 2019-12-29 HISTORY — PX: CYSTOSCOPY: SHX5120

## 2019-12-29 HISTORY — PX: TRANSURETHRAL RESECTION OF BLADDER TUMOR: SHX2575

## 2019-12-29 SURGERY — CYSTOSCOPY
Anesthesia: General | Site: Bladder

## 2019-12-29 MED ORDER — PROPOFOL 10 MG/ML IV BOLUS
INTRAVENOUS | Status: AC
  Filled 2019-12-29: qty 20

## 2019-12-29 MED ORDER — DEXAMETHASONE SODIUM PHOSPHATE 10 MG/ML IJ SOLN
INTRAMUSCULAR | Status: AC
  Filled 2019-12-29: qty 1

## 2019-12-29 MED ORDER — CEFAZOLIN SODIUM-DEXTROSE 2-4 GM/100ML-% IV SOLN
2.0000 g | INTRAVENOUS | Status: AC
  Administered 2019-12-29: 2 g via INTRAVENOUS
  Filled 2019-12-29: qty 100

## 2019-12-29 MED ORDER — SUCCINYLCHOLINE CHLORIDE 200 MG/10ML IV SOSY
PREFILLED_SYRINGE | INTRAVENOUS | Status: AC
  Filled 2019-12-29: qty 10

## 2019-12-29 MED ORDER — FENTANYL CITRATE (PF) 100 MCG/2ML IJ SOLN
INTRAMUSCULAR | Status: AC
  Filled 2019-12-29: qty 2

## 2019-12-29 MED ORDER — OXYCODONE HCL 5 MG PO TABS
ORAL_TABLET | ORAL | Status: AC
  Filled 2019-12-29: qty 1

## 2019-12-29 MED ORDER — LACTATED RINGERS IV SOLN: Freq: Once | INTRAVENOUS | Status: AC

## 2019-12-29 MED ORDER — STERILE WATER FOR IRRIGATION IR SOLN: Status: DC | PRN

## 2019-12-29 MED ORDER — MIDAZOLAM HCL 2 MG/2ML IJ SOLN
INTRAMUSCULAR | Status: DC | PRN
  Administered 2019-12-29: 2 mg via INTRAVENOUS

## 2019-12-29 MED ORDER — ONDANSETRON HCL 4 MG/2ML IJ SOLN: 4.0000 mg | Freq: Once | INTRAMUSCULAR | Status: DC | PRN

## 2019-12-29 MED ORDER — PROMETHAZINE HCL 25 MG RE SUPP: 25.0000 mg | Freq: Four times a day (QID) | RECTAL | 1 refills | Status: DC | PRN

## 2019-12-29 MED ORDER — LIDOCAINE HCL (CARDIAC) PF 50 MG/5ML IV SOSY
PREFILLED_SYRINGE | INTRAVENOUS | Status: DC | PRN
  Administered 2019-12-29: 100 mg via INTRAVENOUS

## 2019-12-29 MED ORDER — OXYCODONE-ACETAMINOPHEN 5-325 MG PO TABS: 1.0000 | ORAL_TABLET | ORAL | 0 refills | Status: DC | PRN

## 2019-12-29 MED ORDER — LIDOCAINE HCL (PF) 1 % IJ SOLN
INTRAMUSCULAR | Status: AC
  Filled 2019-12-29: qty 2

## 2019-12-29 MED ORDER — LACTATED RINGERS IV SOLN
INTRAVENOUS | Status: DC | PRN
Start: 2019-12-29 — End: 2019-12-29

## 2019-12-29 MED ORDER — CHLORHEXIDINE GLUCONATE 0.12 % MT SOLN
15.0000 mL | Freq: Once | OROMUCOSAL | Status: AC
  Administered 2019-12-29: 15 mL via OROMUCOSAL

## 2019-12-29 MED ORDER — SCOPOLAMINE 1 MG/3DAYS TD PT72
1.0000 | MEDICATED_PATCH | Freq: Once | TRANSDERMAL | Status: DC
  Administered 2019-12-29: 1.5 mg via TRANSDERMAL

## 2019-12-29 MED ORDER — LIDOCAINE 2% (20 MG/ML) 5 ML SYRINGE
INTRAMUSCULAR | Status: AC
  Filled 2019-12-29: qty 5

## 2019-12-29 MED ORDER — ORAL CARE MOUTH RINSE: 15.0000 mL | Freq: Once | OROMUCOSAL | Status: AC

## 2019-12-29 MED ORDER — SCOPOLAMINE 1 MG/3DAYS TD PT72
MEDICATED_PATCH | TRANSDERMAL | Status: AC
  Filled 2019-12-29: qty 1

## 2019-12-29 MED ORDER — MIDAZOLAM HCL 2 MG/2ML IJ SOLN
INTRAMUSCULAR | Status: AC
  Filled 2019-12-29: qty 2

## 2019-12-29 MED ORDER — SUCCINYLCHOLINE CHLORIDE 20 MG/ML IJ SOLN
INTRAMUSCULAR | Status: DC | PRN
  Administered 2019-12-29: 140 mg via INTRAVENOUS

## 2019-12-29 MED ORDER — HYDROMORPHONE HCL 1 MG/ML IJ SOLN: 0.2500 mg | INTRAMUSCULAR | Status: DC | PRN

## 2019-12-29 MED ORDER — ONDANSETRON HCL 4 MG/2ML IJ SOLN
INTRAMUSCULAR | Status: AC
  Filled 2019-12-29: qty 2

## 2019-12-29 MED ORDER — OXYCODONE HCL 5 MG PO TABS
5.0000 mg | ORAL_TABLET | Freq: Once | ORAL | Status: AC
  Administered 2019-12-29: 5 mg via ORAL

## 2019-12-29 MED ORDER — DEXAMETHASONE SODIUM PHOSPHATE 10 MG/ML IJ SOLN
INTRAMUSCULAR | Status: DC | PRN
  Administered 2019-12-29: 5 mg via INTRAVENOUS

## 2019-12-29 MED ORDER — FENTANYL CITRATE (PF) 100 MCG/2ML IJ SOLN
INTRAMUSCULAR | Status: DC | PRN
  Administered 2019-12-29: 100 ug via INTRAVENOUS

## 2019-12-29 MED ORDER — MEPERIDINE HCL 50 MG/ML IJ SOLN: 6.2500 mg | INTRAMUSCULAR | Status: DC | PRN

## 2019-12-29 MED ORDER — SUGAMMADEX SODIUM 500 MG/5ML IV SOLN
INTRAVENOUS | Status: DC | PRN
  Administered 2019-12-29: 200 mg via INTRAVENOUS

## 2019-12-29 MED ORDER — ROCURONIUM BROMIDE 10 MG/ML (PF) SYRINGE
PREFILLED_SYRINGE | INTRAVENOUS | Status: AC
  Filled 2019-12-29: qty 10

## 2019-12-29 MED ORDER — PROPOFOL 10 MG/ML IV BOLUS
INTRAVENOUS | Status: DC | PRN
  Administered 2019-12-29: 180 mg via INTRAVENOUS

## 2019-12-29 MED ORDER — ONDANSETRON HCL 4 MG/2ML IJ SOLN
INTRAMUSCULAR | Status: DC | PRN
  Administered 2019-12-29: 4 mg via INTRAVENOUS

## 2019-12-29 SURGICAL SUPPLY — 26 items
BAG DRAIN URO TABLE W/ADPT NS (BAG) ×4 IMPLANT
BAG DRN 8 ADPR NS SKTRN CSTL (BAG) ×2
BAG DRN RND TRDRP ANRFLXCHMBR (UROLOGICAL SUPPLIES) ×2
BAG HAMPER (MISCELLANEOUS) ×4 IMPLANT
BAG URINE DRAIN 2000ML AR STRL (UROLOGICAL SUPPLIES) ×4 IMPLANT
CATH FOLEY 3WAY 30CC 22F (CATHETERS) IMPLANT
CATH FOLEY LATEX FREE 22FR (CATHETERS)
CATH FOLEY LF 22FR (CATHETERS) IMPLANT
CLOTH BEACON ORANGE TIMEOUT ST (SAFETY) ×4 IMPLANT
ELECT LOOP 22F BIPOLAR SML (ELECTROSURGICAL) ×4
ELECT REM PT RETURN 9FT ADLT (ELECTROSURGICAL) ×4
ELECTRODE LOOP 22F BIPOLAR SML (ELECTROSURGICAL) ×2 IMPLANT
ELECTRODE REM PT RTRN 9FT ADLT (ELECTROSURGICAL) ×2 IMPLANT
GLOVE BIO SURGEON STRL SZ8 (GLOVE) ×4 IMPLANT
GLOVE BIOGEL PI IND STRL 7.0 (GLOVE) ×4 IMPLANT
GLOVE BIOGEL PI INDICATOR 7.0 (GLOVE) ×4
GOWN STRL REUS W/TWL LRG LVL3 (GOWN DISPOSABLE) ×4 IMPLANT
GOWN STRL REUS W/TWL XL LVL3 (GOWN DISPOSABLE) ×4 IMPLANT
IV NS IRRIG 3000ML ARTHROMATIC (IV SOLUTION) ×4 IMPLANT
KIT TURNOVER CYSTO (KITS) ×4 IMPLANT
PAD ARMBOARD 7.5X6 YLW CONV (MISCELLANEOUS) ×4 IMPLANT
PLUG CATH AND CAP STER (CATHETERS) ×4 IMPLANT
SYR 30ML LL (SYRINGE) ×4 IMPLANT
SYR TOOMEY 50ML (SYRINGE) ×4 IMPLANT
TOWEL OR 17X26 4PK STRL BLUE (TOWEL DISPOSABLE) ×4 IMPLANT
TRAY CYSTO PACK (CUSTOM PROCEDURE TRAY) ×4 IMPLANT

## 2019-12-29 NOTE — Op Note (Signed)
.  Preoperative diagnosis: bladder tumor  Postoperative diagnosis: Same  Procedure: 1 cystoscopy 2. Transurethral resection of bladder tumor, small  Attending: Rosie Fate  Anesthesia: General  Estimated blood loss: Minimal  Drains: 22 French foley  Specimens: bladder tumor  Antibiotics: ancef  Findings: 1cm papillary left lateral wall tumor at the previous site of resection  Ureteral orifices in normal anatomic location.  Indications: Patient is a 69 year old female with a history of high grade bladder cancer here for repeat resection.  After discussing treatment options, they decided proceed with transurethral resection of a bladder tumor.  Procedure her in detail: The patient was brought to the operating room and a brief timeout was done to ensure correct patient, correct procedure, correct site.  General anesthesia was administered patient was placed in dorsal lithotomy position.  Their genitalia was then prepped and draped in usual sterile fashion.  A rigid 31 French cystoscope was passed in the urethra and the bladder.  Bladder was inspected and we noted  a 1cm bladder tumor at the previous site of resection.  the ureteral orifices were in the normal orthotopic locations.  Using the bipolar resectoscope we removed the bladder tumor down to the base. Hemostasis was then obtained with electrocautery. We then removed the bladder tumor chips and sent them for pathology. We then re-inspected the bladder and found no residula bleeding.  the bladder was then drained, a 22 French foley was placed and this concluded the procedure which was well tolerated by patient.  Complications: None  Condition: Stable, extubated, transferred to PACU  Plan: Patient is to be discharged home and followup in 5 days for foley catheter removal and pathology discussion.

## 2019-12-29 NOTE — Transfer of Care (Signed)
Immediate Anesthesia Transfer of Care Note  Patient: Peggy Hines  Procedure(s) Performed: CYSTOSCOPY (N/A ) TRANSURETHRAL RESECTION OF BLADDER TUMOR (TURBT) (N/A Bladder)  Patient Location: PACU  Anesthesia Type:General  Level of Consciousness: awake, alert , oriented and patient cooperative  Airway & Oxygen Therapy: Patient Spontanous Breathing  Post-op Assessment: Report given to RN, Post -op Vital signs reviewed and stable and Patient moving all extremities X 4  Post vital signs: Reviewed and stable  Last Vitals:  Vitals Value Taken Time  BP    Temp    Pulse 74 12/29/19 1341  Resp 13 12/29/19 1341  SpO2 98 % 12/29/19 1341  Vitals shown include unvalidated device data.  Last Pain:  Vitals:   12/29/19 1207  TempSrc: Oral  PainSc: 0-No pain      Patients Stated Pain Goal: 5 (03/10/00 4996)  Complications: No complications documented.

## 2019-12-29 NOTE — Discharge Instructions (Signed)
Please remove the Scopolamine patch behind your ear on Saturday. This is to help with postoperative nausea.  Wash hands after removal of the patch.     Indwelling Urinary Catheter Care, Adult An indwelling urinary catheter is a thin tube that is put into your bladder. The tube helps to drain pee (urine) out of your body. The tube goes in through your urethra. Your urethra is where pee comes out of your body. Your pee will come out through the catheter, then it will go into a bag (drainage bag). Take good care of your catheter so it will work well. How to wear your catheter and bag Supplies needed  Sticky tape (adhesive tape) or a leg strap.  Alcohol wipe or soap and water (if you use tape).  A clean towel (if you use tape).  Large overnight bag.  Smaller bag (leg bag). Wearing your catheter Attach your catheter to your leg with tape or a leg strap.  Make sure the catheter is not pulled tight.  If a leg strap gets wet, take it off and put on a dry strap.  If you use tape to hold the bag on your leg: 1. Use an alcohol wipe or soap and water to wash your skin where the tape made it sticky before. 2. Use a clean towel to pat-dry that skin. 3. Use new tape to make the bag stay on your leg. Wearing your bags You should have been given a large overnight bag.  You may wear the overnight bag in the day or night.  Always have the overnight bag lower than your bladder.  Do not let the bag touch the floor.  Before you go to sleep, put a clean plastic bag in a wastebasket. Then hang the overnight bag inside the wastebasket. You should also have a smaller leg bag that fits under your clothes.  Always wear the leg bag below your knee.  Do not wear your leg bag at night. How to care for your skin and catheter Supplies needed  A clean washcloth.  Water and mild soap.  A clean towel. Caring for your skin and catheter      Clean the skin around your catheter every  day: 1. Wash your hands with soap and water. 2. Wet a clean washcloth in warm water and mild soap. 3. Clean the skin around your urethra.  If you are female:  Gently spread the folds of skin around your vagina (labia).  With the washcloth in your other hand, wipe the inner side of your labia on each side. Wipe from front to back.  If you are female:  Pull back any skin that covers the end of your penis (foreskin).  With the washcloth in your other hand, wipe your penis in small circles. Start wiping at the tip of your penis, then move away from the catheter.  Move the foreskin back in place, if needed. 4. With your free hand, hold the catheter close to where it goes into your body.  Keep holding the catheter during cleaning so it does not get pulled out. 5. With the washcloth in your other hand, clean the catheter.  Only wipe downward on the catheter.  Do not wipe upward toward your body. Doing this may push germs into your urethra and cause infection. 6. Use a clean towel to pat-dry the catheter and the skin around it. Make sure to wipe off all soap. 7. Wash your hands with soap and water.  Shower  every day. Do not take baths.  Do not use cream, ointment, or lotion on the area where the catheter goes into your body, unless your doctor tells you to.  Do not use powders, sprays, or lotions on your genital area.  Check your skin around the catheter every day for signs of infection. Check for: ? Redness, swelling, or pain. ? Fluid or blood. ? Warmth. ? Pus or a bad smell. How to empty the bag Supplies needed  Rubbing alcohol.  Gauze pad or cotton ball.  Tape or a leg strap. Emptying the bag Pour the pee out of your bag when it is ?- full, or at least 2-3 times a day. Do this for your overnight bag and your leg bag. 1. Wash your hands with soap and water. 2. Separate (detach) the bag from your leg. 3. Hold the bag over the toilet or a clean pail. Keep the bag lower than  your hips and bladder. This is so the pee (urine) does not go back into the tube. 4. Open the pour spout. It is at the bottom of the bag. 5. Empty the pee into the toilet or pail. Do not let the pour spout touch any surface. 6. Put rubbing alcohol on a gauze pad or cotton ball. 7. Use the gauze pad or cotton ball to clean the pour spout. 8. Close the pour spout. 9. Attach the bag to your leg with tape or a leg strap. 10. Wash your hands with soap and water. Follow instructions for cleaning the drainage bag:  From the product maker.  As told by your doctor. How to change the bag Supplies needed  Alcohol wipes.  A clean bag.  Tape or a leg strap. Changing the bag Replace your bag when it starts to leak, smell bad, or look dirty. 1. Wash your hands with soap and water. 2. Separate the dirty bag from your leg. 3. Pinch the catheter with your fingers so that pee does not spill out. 4. Separate the catheter tube from the bag tube where these tubes connect (at the connection valve). Do not let the tubes touch any surface. 5. Clean the end of the catheter tube with an alcohol wipe. Use a different alcohol wipe to clean the end of the bag tube. 6. Connect the catheter tube to the tube of the clean bag. 7. Attach the clean bag to your leg with tape or a leg strap. Do not make the bag tight on your leg. 8. Wash your hands with soap and water. General rules   Never pull on your catheter. Never try to take it out. Doing that can hurt you.  Always wash your hands before and after you touch your catheter or bag. Use a mild, fragrance-free soap. If you do not have soap and water, use hand sanitizer.  Always make sure there are no twists or bends (kinks) in the catheter tube.  Always make sure there are no leaks in the catheter or bag.  Drink enough fluid to keep your pee pale yellow.  Do not take baths, swim, or use a hot tub.  If you are female, wipe from front to back after you poop  (have a bowel movement). Contact a doctor if:  Your pee is cloudy.  Your pee smells worse than usual.  Your catheter gets clogged.  Your catheter leaks.  Your bladder feels full. Get help right away if:  You have redness, swelling, or pain where the catheter goes into  your body.  You have fluid, blood, pus, or a bad smell coming from the area where the catheter goes into your body.  Your skin feels warm where the catheter goes into your body.  You have a fever.  You have pain in your: ? Belly (abdomen). ? Legs. ? Lower back. ? Bladder.  You see blood in the catheter.  Your pee is pink or red.  You feel sick to your stomach (nauseous).  You throw up (vomit).  You have chills.  Your pee is not draining into the bag.  Your catheter gets pulled out. Summary  An indwelling urinary catheter is a thin tube that is placed into the bladder to help drain pee (urine) out of the body.  The catheter is placed into the part of the body that drains pee from the bladder (urethra).  Taking good care of your catheter will keep it working properly and help prevent problems.  Always wash your hands before and after touching your catheter or bag.  Never pull on your catheter or try to take it out. This information is not intended to replace advice given to you by your health care provider. Make sure you discuss any questions you have with your health care provider. Document Revised: 10/16/2018 Document Reviewed: 02/07/2017 Elsevier Patient Education  2020 [place] Anesthesia, Adult, Care After This sheet gives you information about how to care for yourself after your procedure. Your health care provider may also give you more specific instructions. If you have problems or questions, contact your health care provider. What can I expect after the procedure? After the procedure, the following side effects are common:  Pain or discomfort at the IV  site.  Nausea.  Vomiting.  Sore throat.  Trouble concentrating.  Feeling cold or chills.  Weak or tired.  Sleepiness and fatigue.  Soreness and body aches. These side effects can affect parts of the body that were not involved in surgery. Follow these instructions at home:  For at least 24 hours after the procedure:  Have a responsible adult stay with you. It is important to have someone help care for you until you are awake and alert.  Rest as needed.  Do not: ? Participate in activities in which you could fall or become injured. ? Drive. ? Use heavy machinery. ? Drink alcohol. ? Take sleeping pills or medicines that cause drowsiness. ? Make important decisions or sign legal documents. ? Take care of children on your own. Eating and drinking  Follow any instructions from your health care provider about eating or drinking restrictions.  When you feel hungry, start by eating small amounts of foods that are soft and easy to digest (bland), such as toast. Gradually return to your regular diet.  Drink enough fluid to keep your urine pale yellow.  If you vomit, rehydrate by drinking water, juice, or clear broth. General instructions  If you have sleep apnea, surgery and certain medicines can increase your risk for breathing problems. Follow instructions from your health care provider about wearing your sleep device: ? Anytime you are sleeping, including during daytime naps. ? While taking prescription pain medicines, sleeping medicines, or medicines that make you drowsy.  Return to your normal activities as told by your health care provider. Ask your health care provider what activities are safe for you.  Take over-the-counter and prescription medicines only as told by your health care provider.  If you smoke, do not smoke without  supervision.  Keep all follow-up visits as told by your health care provider. This is important. Contact a health care provider if:  You  have nausea or vomiting that does not get better with medicine.  You cannot eat or drink without vomiting.  You have pain that does not get better with medicine.  You are unable to pass urine.  You develop a skin rash.  You have a fever.  You have redness around your IV site that gets worse. Get help right away if:  You have difficulty breathing.  You have chest pain.  You have blood in your urine or stool, or you vomit blood. Summary  After the procedure, it is common to have a sore throat or nausea. It is also common to feel tired.  Have a responsible adult stay with you for the first 24 hours after general anesthesia. It is important to have someone help care for you until you are awake and alert.  When you feel hungry, start by eating small amounts of foods that are soft and easy to digest (bland), such as toast. Gradually return to your regular diet.  Drink enough fluid to keep your urine pale yellow.  Return to your normal activities as told by your health care provider. Ask your health care provider what activities are safe for you. This information is not intended to replace advice given to you by your health care provider. Make sure you discuss any questions you have with your health care provider. Document Revised: 06/27/2017 Document Reviewed: 02/07/2017 Elsevier Patient Education  [place].

## 2019-12-29 NOTE — Interval H&P Note (Signed)
History and Physical Interval Note:  12/29/2019 12:31 PM  Peggy Hines  has presented today for surgery, with the diagnosis of bladder cancer.  The various methods of treatment have been discussed with the patient and family. After consideration of risks, benefits and other options for treatment, the patient has consented to  Procedure(s): CYSTOSCOPY (N/A) TRANSURETHRAL RESECTION OF BLADDER TUMOR (TURBT) (N/A) as a surgical intervention.  The patient's history has been reviewed, patient examined, no change in status, stable for surgery.  I have reviewed the patient's chart and labs.  Questions were answered to the patient's satisfaction.     [name]

## 2019-12-29 NOTE — Anesthesia Procedure Notes (Signed)
Procedure Name: Intubation Performed by: Tacy Learn, CRNA Pre-anesthesia Checklist: Patient identified, Emergency Drugs available, Suction available, Patient being monitored and Timeout performed Patient Re-evaluated:Patient Re-evaluated prior to induction Oxygen Delivery Method: Circle system utilized Preoxygenation: Pre-oxygenation with 100% oxygen Induction Type: IV induction Laryngoscope Size: Miller and 2 Grade View: Grade I Tube type: Oral Tube size: 7.0 mm Number of attempts: 1 Airway Equipment and Method: Stylet Placement Confirmation: ETT inserted through vocal cords under direct vision,  positive ETCO2,  CO2 detector and breath sounds checked- equal and bilateral Secured at: 21 cm Tube secured with: Tape Dental Injury: Teeth and Oropharynx as per pre-operative assessment

## 2019-12-29 NOTE — Anesthesia Postprocedure Evaluation (Signed)
Anesthesia Post Note  Patient: TAMALA MANZER  Procedure(s) Performed: CYSTOSCOPY (N/A ) TRANSURETHRAL RESECTION OF BLADDER TUMOR (TURBT) (N/A Bladder)  Patient location during evaluation: PACU Anesthesia Type: General Level of consciousness: awake, oriented, awake and alert and patient cooperative Pain management: pain level controlled Vital Signs Assessment: post-procedure vital signs reviewed and stable Respiratory status: spontaneous breathing, respiratory function stable and nonlabored ventilation Cardiovascular status: blood pressure returned to baseline and stable Postop Assessment: no headache and no backache Anesthetic complications: no   No complications documented.   Last Vitals:  Vitals:   12/29/19 1207  BP: (!) 163/80  Pulse: 88  Resp: 18  Temp: 36.7 C  SpO2: 99%    Last Pain:  Vitals:   12/29/19 1207  TempSrc: Oral  PainSc: 0-No pain                 Tacy Learn

## 2019-12-29 NOTE — Anesthesia Preprocedure Evaluation (Addendum)
Anesthesia Evaluation  Patient identified by MRN, date of birth, ID band Patient awake    Reviewed: Allergy & Precautions, NPO status , Patient's Chart, lab work & pertinent test results  History of Anesthesia Complications (+) PONV and history of anesthetic complications  Airway Mallampati: II  TM Distance: >3 FB Neck ROM: Full   Comment: Neck fusion  Dental  (+) Dental Advisory Given, Missing   Pulmonary neg pulmonary ROS,    Pulmonary exam normal breath sounds clear to auscultation       Cardiovascular Exercise Tolerance: Good Normal cardiovascular exam Rhythm:Regular Rate:Normal     Neuro/Psych negative neurological ROS  negative psych ROS   GI/Hepatic Neg liver ROS, GERD  Medicated and Controlled,  Endo/Other  negative endocrine ROS  Renal/GU negative Renal ROS  negative genitourinary   Musculoskeletal Neck fusion   Abdominal   Peds negative pediatric ROS (+)  Hematology negative hematology ROS (+)   Anesthesia Other Findings   Reproductive/Obstetrics negative OB ROS                           Anesthesia Physical Anesthesia Plan  ASA: II  Anesthesia Plan: General   Post-op Pain Management:    Induction: Intravenous  PONV Risk Score and Plan: 4 or greater and Ondansetron, Dexamethasone, Midazolam and Scopolamine patch - Pre-op  Airway Management Planned: Oral ETT  Additional Equipment:   Intra-op Plan:   Post-operative Plan: Extubation in OR  Informed Consent: I have reviewed the patients History and Physical, chart, labs and discussed the procedure including the risks, benefits and alternatives for the proposed anesthesia with the patient or authorized representative who has indicated his/her understanding and acceptance.     Dental advisory given  Plan Discussed with: CRNA and Surgeon  Anesthesia Plan Comments:        Anesthesia Quick Evaluation

## 2019-12-30 ENCOUNTER — Encounter (HOSPITAL_COMMUNITY): Payer: Self-pay | Admitting: Urology

## 2019-12-31 LAB — SURGICAL PATHOLOGY

## 2020-01-03 ENCOUNTER — Other Ambulatory Visit: Payer: Self-pay

## 2020-01-03 ENCOUNTER — Encounter: Payer: Self-pay | Admitting: Urology

## 2020-01-03 ENCOUNTER — Ambulatory Visit (INDEPENDENT_AMBULATORY_CARE_PROVIDER_SITE_OTHER): Payer: Medicare PPO | Admitting: Urology

## 2020-01-03 VITALS — BP 142/83 | HR 121 | Temp 97.0°F | Ht 68.0 in | Wt 200.0 lb

## 2020-01-03 DIAGNOSIS — C672 Malignant neoplasm of lateral wall of bladder: Secondary | ICD-10-CM | POA: Diagnosis not present

## 2020-01-03 NOTE — Progress Notes (Signed)
Uroflow  Peak Flow: 43ml Average Flow: 48ml Voided Volume: 177ml Voiding Time: 33sec Flow Time: 17sec Time to Peak Flow: 21sec

## 2020-01-03 NOTE — Patient Instructions (Addendum)
Patient Education: (BCG) Into the Bladder (Intravesical Chemotherapy)  BCG is a vaccine which is used to prevent tuberculosis (TB).  But it's also a helpful treatment for some early bladder cancers.  When BCG goes directly into the bladder the treatment is described as intravesical.  BCG is a type of immunotherapy.  Immunotherapy stimulates the body's immune system to destroy cancer cells.  How it's given BCG treatment is given to you in an outpatient setting.  It takes a few minutes to administer and you can go home as soon as it's finished.  It might be a good idea to ask someone to bring you, particularly the fist time.  Unlike chemotherapy into the bladder, BCG treatment is never given immediately after surgery to remove bladder tumors.  There needs to be a delay usually of at least two weeks after surgery, before you can have it.  You won't be given treatment with BCG if you are unwell or have an infection in your urine.  You're usually asked to limit the amount you drink before your treatment.  This will help to increase the concentration of BCG in your bladder.  Drinking too much before your treatment may make your bladder feel uncomfortably full.  If you normally take water tablets (diuretics) take them later in the day after your treatment.  Your nurse or doctor will give you more advise about preparing for your treatment.  You will have a small tube (catheter) placed into your bladder.  Your doctor will then put the liquid vaccine directly into your bladder through the catheter and remove the catheter.  You will need to hold your urine for two hours afterwards.  This can be difficult but it's to give the treatment time to work.  You can walk around during this time.  When the treatment is over you can go to the toilet.  After your treatment there are some precautions you'll need to take.  This is because BCG is a live vaccine and other people shouldn't be exposed to it.  For the next six  hours, you'll need to avoid your urine splashing on the toilet seat and getting any urine on your hands.  It might be easer for men to sit down when they're using an ordinary toilet although using a stand up urinal should be alright.  The main this is to avoid splashing urine and spreading the vaccine.  You will also be asked to put 1/2 cup undiluted bleach into the toilet to destroy any live vaccine and leave it for 15 minutes until you flush.  Side Effects Because BCG goes directly into the bladder most of the side effects are linked with the bladder.  They usually go away within one to two days after your treatment.  The most common ones are: -needing to pass urine often -pain when you pass urine -blood in urine -flu-like symptoms (tiredness, general aching and raised temperature)  Theses side effects should settle down within a day or two.  If they don't get better contact your doctor.  Drinking lots of fluids can help flush the drug out of your bladder and reduce some of these effects.  Taking Ibuprofen or Aleve is encouraged unless you have a condition that would make these medications unsafe to take (renal failure, diabetes, gerd)  Rare side effects can include a continuing high temperature (fever), pain in your joints and a cough.  If you have any of these symptoms, or if you feel generally unwell, contact your   doctor.  These symptoms could be a sign of a more serious infection (due to BCG) that needs to be treated immediately.  If this happens you'll be treated with the same drugs (antibiotics) that are used to treat TB.  Contraception Men should use a condom during sex for the first 48 hours after their treatment.  If you are a women who has had BCG treatment then your partner should use a condom.  Using a condom will protect your partner from any vaccine present in your semen or vaginal fluid.  We don't know how BCG may affect a developing fetus so it's not advisable to become pregnant or  father a child while having it.  It is important to use effective contraception during your treatment and for six weeks afterwards.  You can discuss this with your doctor or specialist nurse.           Bladder Cancer  Bladder cancer is an abnormal growth of tissue in the bladder. The bladder is the balloon-like sac in the pelvis. It collects and stores urine that comes from the kidneys through the ureters. The bladder wall is made of layers. If cancer spreads into these layers and through the wall of the bladder, it becomes more difficult to treat. What are the causes? The cause of this condition is not known. What increases the risk? The following factors may make you more likely to develop this condition:  Smoking.  Workplace risks (occupational exposures), such as rubber, leather, textile, dyes, chemicals, and paint.  Being white.  Your age. Most people with bladder cancer are over the age of 5.  Being female.  Having chronic bladder inflammation.  Having a personal history of bladder cancer.  Having a family history of bladder cancer (heredity).  Having had chemotherapy or radiation therapy to the pelvis.  Having been exposed to arsenic. What are the signs or symptoms? Initial symptoms of this condition include:  Blood in the urine.  Painful urination.  Frequent bladder or urine infections.  Increase in urgency and frequency of urination. Advanced symptoms of this condition include:  Not being able to urinate.  Low back pain on one side.  Loss of appetite.  Weight loss.  Fatigue.  Swelling in the feet.  Bone pain. How is this diagnosed? This condition is diagnosed based on your medical history, a physical exam, urine tests, lab tests, imaging tests, and your symptoms. You may also have other tests or procedures done, such as:  A narrow tube being inserted into your bladder through your urethra (cystoscopy) in order to view the lining of your  bladder for tumors.  A biopsy to sample the tumor to see if cancer is present. If cancer is present, it will then be staged to determine its severity and extent. Staging is an assessment of:  The size of the tumor.  Whether the cancer has spread.  Where the cancer has spread. It is important to know how deeply into the bladder wall cancer has grown and whether cancer has spread to any other parts of your body. Staging may require blood tests or imaging tests, such as a CT scan, MRI, bone scan, or chest X-ray. How is this treated? Based on the stage of cancer, one treatment or a combination of treatments may be recommended. The most common forms of treatment are:  Surgery to remove the cancer. Procedures that may be done include transurethral resection and cystectomy.  Radiation therapy. This is high-energy X-rays or other particles.  This is often used in combination with chemotherapy.  Chemotherapy. During this treatment, medicines are used to kill cancer cells.  Immunotherapy. This uses medicines to help your own immune system destroy cancer cells. Follow these instructions at home:  Take over-the-counter and prescription medicines only as told by your health care provider.  Maintain a healthy diet. Some of your treatments might affect your appetite.  Consider joining a support group. This may help you learn to cope with the stress of having bladder cancer.  Tell your cancer care team if you develop side effects. They may be able to recommend ways to relieve them.  Keep all follow-up visits as told by your health care provider. This is important. Where to find more information  American Cancer Society: www.cancer.[place] ([place]): www.cancer.gov Contact a health care provider if:  You have symptoms of a urinary tract infection. These include: ? Fever. ? Chills. ? Weakness. ? Muscle aches. ? Abdominal pain. ? Frequent and intense urge to  urinate. ? Burning feeling in the bladder or urethra during urination. Get help right away if:  There is blood in your urine.  You cannot urinate.  You have severe pain or other symptoms that do not go away. Summary  Bladder cancer is an abnormal growth of tissue in the bladder.  This condition is diagnosed based on your medical history, a physical exam, urine tests, lab tests, imaging tests, and your symptoms.  Based on the stage of cancer, surgery, chemotherapy, or a combination of treatments may be recommended.  Consider joining a support group. This may help you learn to cope with the stress of having bladder cancer. This information is not intended to replace advice given to you by your health care provider. Make sure you discuss any questions you have with your health care provider. Document Revised: 06/06/2017 Document Reviewed: 05/28/2016 Elsevier Patient Education  2020 [employer].

## 2020-01-03 NOTE — Progress Notes (Signed)
01/03/2020 8:56 AM   Peggy Hines Guard [birth year] [id number]  Referring provider: Sharilyn Sites, MD [address] [place],  [place] [address]  Bladder cancer  HPI: [name] is a [id number] here for followup after bladder tumor resection. 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 She passed her voiding trial today   PMH: Past Medical History:  Diagnosis Date  . Acid reflux   . Hyperlipidemia   . Mucous membrane pemphigoid   . Mucous membrane pemphigoid   . PONV (postoperative nausea and vomiting)    pt requests zofran before surgery.    Surgical History: Past Surgical History:  Procedure Laterality Date  . ABDOMINAL HYSTERECTOMY    . APPENDECTOMY    . CHOLECYSTECTOMY    . CYSTOSCOPY N/A 12/29/2019   Procedure: CYSTOSCOPY;  Surgeon: Cleon Gustin, MD;  Location: [facility];  Service: Urology;  Laterality: N/A;  . CYSTOSCOPY W/ RETROGRADES Bilateral 11/22/2019   Procedure: CYSTOSCOPY WITH BILATERAL RETROGRADE PYELOGRAM;  Surgeon: Cleon Gustin, MD;  Location: [facility];  Service: Urology;  Laterality: Bilateral;  . SPINAL FUSION  2017   C 5/6 C 6/7  . TRANSURETHRAL RESECTION OF BLADDER TUMOR Bilateral 11/22/2019   Procedure: TRANSURETHRAL RESECTION OF BLADDER TUMOR (TURBT);  Surgeon: Cleon Gustin, MD;  Location: [facility];  Service: Urology;  Laterality: Bilateral;  . TRANSURETHRAL RESECTION OF BLADDER TUMOR N/A 12/29/2019   Procedure: TRANSURETHRAL RESECTION OF BLADDER TUMOR (TURBT);  Surgeon: Cleon Gustin, MD;  Location: [facility];  Service: Urology;  Laterality: N/A;    Home Medications:  Allergies as of 01/03/2020      Reactions   Tamiflu  [oseltamivir] Nausea And Vomiting      Medication List       Accurate as of January 03, 2020  8:56 AM. If you have any questions, ask your nurse or doctor.        acetaminophen 500 MG tablet Commonly known as: TYLENOL Take 500-1,000 mg by mouth every 6 (six) hours as needed (headaches/pain.).   aspirin EC 81 MG tablet Take 81 mg by  mouth every evening.   atorvastatin 20 MG tablet Commonly known as: LIPITOR Take 20 mg by mouth every evening.   estradiol 0.05 MG/24HR patch Commonly known as: VIVELLE-DOT Place 0.5 patches onto the skin once a week.   famotidine 20 MG tablet Commonly known as: PEPCID Take 20 mg by mouth at bedtime.   Fish Oil 1000 MG Caps Take 2,000 mg by mouth daily.   oxyCODONE-acetaminophen 5-325 MG tablet Commonly known as: Percocet Take 1 tablet by mouth every 4 (four) hours as needed for severe pain.   PRESERVISION AREDS 2 PO Take 1 tablet by mouth at bedtime.   promethazine 25 MG suppository Commonly known as: Phenergan Place 1 suppository (25 mg total) rectally every 6 (six) hours as needed for nausea.   RITUXAN IV Inject 1 Dose into the vein every 6 (six) months.   Vitamin D3 50 MCG (2000 UT) Tabs Take 2,000 Units by mouth every evening.       Allergies:  Allergies  Allergen Reactions  . Tamiflu  [Oseltamivir] Nausea And Vomiting    Family History: Family History  Problem Relation Age of Onset  . Breast cancer Sister 48  . Stroke Father   . Stroke Mother     Social History:  reports that she has never smoked. She has never used smokeless tobacco. She reports that she does not drink alcohol and does not use drugs.  ROS: All other review  of systems were reviewed and are negative except what is noted above in HPI  Physical Exam: BP (!) 142/83   Pulse (!) 121   Temp (!) 97 F (36.1 C)   Ht 5\' 8"  (1.727 m)   Wt 200 lb (90.7 kg)   BMI 30.41 kg/m   Constitutional:  Alert and oriented, No acute distress. HEENT: Nadine AT, moist mucus membranes.  Trachea midline, no masses. Cardiovascular: No clubbing, cyanosis, or edema. Respiratory: Normal respiratory effort, no increased work of breathing. GI: Abdomen is soft, nontender, nondistended, no abdominal masses GU: No CVA tenderness.  Lymph: No cervical or inguinal lymphadenopathy. Skin: No rashes, bruises or  suspicious lesions. Neurologic: Grossly intact, no focal deficits, moving all 4 extremities. Psychiatric: Normal mood and affect.  Laboratory Data: No results found for: WBC, HGB, HCT, MCV, PLT  Lab Results  Component Value Date   CREATININE 1.17 (H) 09/15/2019    No results found for: PSA  No results found for: TESTOSTERONE  No results found for: HGBA1C  Urinalysis    Component Value Date/Time   BILIRUBINUR neg 10/27/2019 1037   PROTEINUR Positive (A) 10/27/2019 1037   UROBILINOGEN 0.2 10/27/2019 1037   NITRITE neg 10/27/2019 1037   LEUKOCYTESUR Negative 10/27/2019 1037    No results found for: LABMICR, WBCUA, RBCUA, LABEPIT, MUCUS, BACTERIA  Pertinent Imaging:  No results found for this or any previous visit.  No results found for this or any previous visit.  No results found for this or any previous visit.  No results found for this or any previous visit.  No results found for this or any previous visit.  No results found for this or any previous visit.  Results for orders placed during the hospital encounter of 10/18/19  CT HEMATURIA WORKUP  Narrative CLINICAL DATA:  Gross hematuria since 12/26/2018, UTI symptoms.  EXAM: CT ABDOMEN AND PELVIS WITHOUT AND WITH CONTRAST  TECHNIQUE: Multidetector CT imaging of the abdomen and pelvis was performed following the standard protocol before and following the bolus administration of intravenous contrast.  CONTRAST:  125 cc Omnipaque 300.  COMPARISON:  CT chest 03/03/2018.  FINDINGS: Lower chest: 2 mm peripheral left lower lobe nodule (6/15), likely benign. Lung bases are otherwise clear. Heart size normal. No pericardial or pleural effusion. Distal esophagus is unremarkable.  Hepatobiliary: Liver is slightly decreased in attenuation diffusely. Cholecystectomy. No biliary ductal dilatation.  Pancreas: Negative.  Spleen: Negative.  Adrenals/Urinary Tract: Adrenal glands are unremarkable. No  urinary stones or obstruction. Low-attenuation lesions in the kidneys measure up to 9 mm on the right and are likely cysts. No filling defects in the intrarenal collecting systems, ureters or opacified portion of the bladder.  Stomach/Bowel: Stomach, small bowel and colon are unremarkable. Appendix is not readily visualized.  Vascular/Lymphatic: Atherosclerotic calcification of the aorta without aneurysm. No pathologically enlarged lymph nodes.  Reproductive: Hysterectomy.  No adnexal mass.  Other: No free fluid.  Mesenteries and peritoneum are unremarkable.  Musculoskeletal: Degenerative changes in the spine. No worrisome lytic or sclerotic lesions.  IMPRESSION: 1. No findings to explain the patient's symptoms. 2. Hepatic steatosis. 3.  Aortic atherosclerosis (ICD10-I70.0).   Electronically Signed By: Lorin Picket M.D. On: 10/18/2019 10:15  No results found for this or any previous visit.   Assessment & Plan:    1. Malignant neoplasm of lateral wall of urinary bladder (HCC) -We discussed the management of CIS and high grade bladder cancer including surveillance, BCG, mitromycin C, epirubicin, and radical cystectomy.  No follow-ups on file.  [name], MD  [facility] [place]

## 2020-01-03 NOTE — Progress Notes (Signed)

## 2020-01-31 ENCOUNTER — Ambulatory Visit (INDEPENDENT_AMBULATORY_CARE_PROVIDER_SITE_OTHER): Payer: Medicare PPO

## 2020-01-31 ENCOUNTER — Other Ambulatory Visit: Payer: Self-pay

## 2020-01-31 DIAGNOSIS — C672 Malignant neoplasm of lateral wall of bladder: Secondary | ICD-10-CM

## 2020-01-31 LAB — URINALYSIS, ROUTINE W REFLEX MICROSCOPIC
Bilirubin, UA: NEGATIVE
Glucose, UA: NEGATIVE
Ketones, UA: NEGATIVE
Leukocytes,UA: NEGATIVE
Nitrite, UA: NEGATIVE
Specific Gravity, UA: >1.03 — ABNORMAL HIGH (ref 1.005–1.030)
Urobilinogen, Ur: 0.2 mg/dL (ref 0.2–1.0)
pH, UA: 5 (ref 5.0–7.5)

## 2020-01-31 LAB — MICROSCOPIC EXAMINATION

## 2020-01-31 MED ORDER — BCG LIVE 50 MG IS SUSR
3.2400 mL | Freq: Once | INTRAVESICAL | Status: AC
Start: 2020-01-31 — End: 2020-01-31
  Administered 2020-01-31: 81 mg via INTRAVESICAL

## 2020-01-31 NOTE — Patient Instructions (Signed)

## 2020-01-31 NOTE — Progress Notes (Signed)
BCG Bladder Instillation  BCG # 1 of 6  Due to Bladder Cancer patient is present today for a BCG treatment. Patient was cleaned and prepped in a sterile fashion with betadine. A 14FR catheter was inserted, urine return was noted 62ml, urine was lite yellow in color.  73ml of reconstituted BCG was instilled into the bladder. The catheter was then removed. Patient tolerated well, no complications were noted  Performed by: Jacquan Savas, LPN  Follow up/ Additional notes: Keep next scheduled BCG appt for #2 of 6

## 2020-02-07 ENCOUNTER — Ambulatory Visit: Payer: Medicare PPO

## 2020-02-08 ENCOUNTER — Ambulatory Visit (INDEPENDENT_AMBULATORY_CARE_PROVIDER_SITE_OTHER): Payer: Medicare PPO

## 2020-02-08 ENCOUNTER — Other Ambulatory Visit: Payer: Self-pay

## 2020-02-08 DIAGNOSIS — C672 Malignant neoplasm of lateral wall of bladder: Secondary | ICD-10-CM | POA: Diagnosis not present

## 2020-02-08 DIAGNOSIS — N3021 Other chronic cystitis with hematuria: Secondary | ICD-10-CM | POA: Diagnosis not present

## 2020-02-08 LAB — MICROSCOPIC EXAMINATION
Epithelial Cells (non renal): >10 /hpf — AB (ref 0–10)
Renal Epithel, UA: NONE SEEN /hpf
WBC, UA: >30 /hpf — AB (ref 0–5)

## 2020-02-08 LAB — URINALYSIS, ROUTINE W REFLEX MICROSCOPIC
Bilirubin, UA: NEGATIVE
Glucose, UA: NEGATIVE
Ketones, UA: NEGATIVE
Nitrite, UA: POSITIVE — AB
Protein,UA: NEGATIVE
RBC, UA: NEGATIVE
Specific Gravity, UA: 1.025 (ref 1.005–1.030)
Urobilinogen, Ur: 0.2 mg/dL (ref 0.2–1.0)
pH, UA: 5 (ref 5.0–7.5)

## 2020-02-08 MED ORDER — BCG LIVE 50 MG IS SUSR
3.2400 mL | Freq: Once | INTRAVESICAL | Status: AC
  Administered 2020-02-08: 81 mg via INTRAVESICAL

## 2020-02-08 NOTE — Progress Notes (Signed)
BCG Bladder Instillation  BCG # 2 of 6  Due to Bladder Cancer patient is present today for a BCG treatment. Patient was cleaned and prepped in a sterile fashion with betadine. A 14FR catheter was inserted, urine return was noted 148ml, urine was light yellow in color.  8ml of reconstituted BCG was instilled into the bladder. The catheter was then removed. Patient tolerated well, no complications were noted  Performed by: Jeral Zick,LPN  Follow up/ Additional notes: Keep next scheduled NV

## 2020-02-11 LAB — URINE CULTURE

## 2020-02-14 ENCOUNTER — Ambulatory Visit (INDEPENDENT_AMBULATORY_CARE_PROVIDER_SITE_OTHER): Payer: Medicare PPO

## 2020-02-14 ENCOUNTER — Other Ambulatory Visit: Payer: Self-pay

## 2020-02-14 DIAGNOSIS — C672 Malignant neoplasm of lateral wall of bladder: Secondary | ICD-10-CM

## 2020-02-14 DIAGNOSIS — R3129 Other microscopic hematuria: Secondary | ICD-10-CM | POA: Diagnosis not present

## 2020-02-14 LAB — URINALYSIS, ROUTINE W REFLEX MICROSCOPIC
Bilirubin, UA: NEGATIVE
Glucose, UA: NEGATIVE
Ketones, UA: NEGATIVE
Nitrite, UA: NEGATIVE
Protein,UA: NEGATIVE
RBC, UA: NEGATIVE
Specific Gravity, UA: 1.025 (ref 1.005–1.030)
Urobilinogen, Ur: 0.2 mg/dL (ref 0.2–1.0)
pH, UA: 5.5 (ref 5.0–7.5)

## 2020-02-14 LAB — MICROSCOPIC EXAMINATION
RBC, Urine: NONE SEEN /hpf (ref 0–2)
Renal Epithel, UA: NONE SEEN /hpf

## 2020-02-14 MED ORDER — BCG LIVE 50 MG IS SUSR: 3.2400 mL | Freq: Once | INTRAVESICAL | Status: DC

## 2020-02-14 MED ORDER — SULFAMETHOXAZOLE-TRIMETHOPRIM 800-160 MG PO TABS: 1.0000 | ORAL_TABLET | Freq: Two times a day (BID) | ORAL | 0 refills | Status: DC

## 2020-02-14 NOTE — Progress Notes (Signed)
Pt came by office today for BCG treatment- Culture was positive from last week. Reviewed with Dr. Alyson Ingles- Bactrim sent in and explained to pt. Pt will start medication today and return Friday for BCG treatment.

## 2020-02-15 ENCOUNTER — Other Ambulatory Visit: Payer: Self-pay | Admitting: Gynecology

## 2020-02-15 DIAGNOSIS — Z1231 Encounter for screening mammogram for malignant neoplasm of breast: Secondary | ICD-10-CM

## 2020-02-18 ENCOUNTER — Ambulatory Visit: Payer: Medicare PPO

## 2020-02-21 ENCOUNTER — Ambulatory Visit: Payer: Medicare PPO

## 2020-02-22 ENCOUNTER — Ambulatory Visit (INDEPENDENT_AMBULATORY_CARE_PROVIDER_SITE_OTHER): Payer: Medicare PPO

## 2020-02-22 ENCOUNTER — Other Ambulatory Visit: Payer: Self-pay

## 2020-02-22 DIAGNOSIS — C672 Malignant neoplasm of lateral wall of bladder: Secondary | ICD-10-CM

## 2020-02-22 LAB — URINALYSIS, ROUTINE W REFLEX MICROSCOPIC
Bilirubin, UA: NEGATIVE
Glucose, UA: NEGATIVE
Ketones, UA: NEGATIVE
Leukocytes,UA: NEGATIVE
Nitrite, UA: NEGATIVE
Protein,UA: NEGATIVE
RBC, UA: NEGATIVE
Specific Gravity, UA: 1.025 (ref 1.005–1.030)
Urobilinogen, Ur: 0.2 mg/dL (ref 0.2–1.0)
pH, UA: 5 (ref 5.0–7.5)

## 2020-02-22 MED ORDER — BCG LIVE 50 MG IS SUSR
3.2400 mL | Freq: Once | INTRAVESICAL | Status: AC
  Administered 2020-02-22: 81 mg via INTRAVESICAL

## 2020-02-22 NOTE — Progress Notes (Signed)
BCG Bladder Instillation  BCG # 3  Due to Bladder Cancer patient is present today for a BCG treatment. Patient was cleaned and prepped in a sterile fashion with betadine. A 14FR catheter was inserted, urine return was noted 48ml, urine was yellow in color.  83ml of reconstituted BCG was instilled into the bladder. The catheter was then removed. Patient tolerated well, no complications were noted  Performed by: Haadi Santellan,LPN  Follow up/ Additional notes: Keep next scheduled NV

## 2020-02-22 NOTE — Patient Instructions (Signed)

## 2020-02-28 ENCOUNTER — Ambulatory Visit
Admission: RE | Admit: 2020-02-28 | Discharge: 2020-02-28 | Disposition: A | Discharge status: Home / Self Care | Payer: Medicare PPO | Source: Ambulatory Visit | Attending: Gynecology | Admitting: Gynecology

## 2020-02-28 ENCOUNTER — Ambulatory Visit: Payer: Medicare PPO

## 2020-02-28 ENCOUNTER — Other Ambulatory Visit: Payer: Self-pay

## 2020-02-28 DIAGNOSIS — Z1231 Encounter for screening mammogram for malignant neoplasm of breast: Secondary | ICD-10-CM | POA: Diagnosis not present

## 2020-03-01 ENCOUNTER — Ambulatory Visit (INDEPENDENT_AMBULATORY_CARE_PROVIDER_SITE_OTHER): Payer: Medicare PPO

## 2020-03-01 ENCOUNTER — Other Ambulatory Visit: Payer: Self-pay

## 2020-03-01 DIAGNOSIS — C672 Malignant neoplasm of lateral wall of bladder: Secondary | ICD-10-CM

## 2020-03-01 LAB — URINALYSIS, ROUTINE W REFLEX MICROSCOPIC
Bilirubin, UA: NEGATIVE
Glucose, UA: NEGATIVE
Ketones, UA: NEGATIVE
Nitrite, UA: NEGATIVE
Protein,UA: NEGATIVE
RBC, UA: NEGATIVE
Specific Gravity, UA: 1.02 (ref 1.005–1.030)
Urobilinogen, Ur: 0.2 mg/dL (ref 0.2–1.0)
pH, UA: 5 (ref 5.0–7.5)

## 2020-03-01 LAB — MICROSCOPIC EXAMINATION
RBC, Urine: NONE SEEN /hpf (ref 0–2)
Renal Epithel, UA: NONE SEEN /hpf

## 2020-03-01 MED ORDER — BCG LIVE 50 MG IS SUSR
3.2400 mL | Freq: Once | INTRAVESICAL | Status: AC
  Administered 2020-03-01: 81 mg via INTRAVESICAL

## 2020-03-01 NOTE — Progress Notes (Signed)
BCG Bladder Instillation  BCG # 4  Due to Bladder Cancer patient is present today for a BCG treatment. Patient was cleaned and prepped in a sterile fashion with betadine. A 14FR catheter was inserted, urine return was noted 53ml, urine was yellow in color.  75ml of reconstituted BCG was instilled into the bladder. The catheter was then removed. Patient tolerated well, no complications were noted  Performed by: Earlie Schank, LPN  Follow up/ Additional notes: Keep next scheduled NV

## 2020-03-06 ENCOUNTER — Ambulatory Visit: Payer: Medicare PPO

## 2020-03-07 ENCOUNTER — Other Ambulatory Visit: Payer: Self-pay

## 2020-03-07 ENCOUNTER — Ambulatory Visit (INDEPENDENT_AMBULATORY_CARE_PROVIDER_SITE_OTHER): Payer: Medicare PPO

## 2020-03-07 ENCOUNTER — Ambulatory Visit: Payer: Medicare PPO

## 2020-03-07 DIAGNOSIS — C672 Malignant neoplasm of lateral wall of bladder: Secondary | ICD-10-CM

## 2020-03-07 LAB — URINALYSIS, ROUTINE W REFLEX MICROSCOPIC
Bilirubin, UA: NEGATIVE
Glucose, UA: NEGATIVE
Ketones, UA: NEGATIVE
Leukocytes,UA: NEGATIVE
Nitrite, UA: NEGATIVE
Protein,UA: NEGATIVE
RBC, UA: NEGATIVE
Specific Gravity, UA: 1.02 (ref 1.005–1.030)
Urobilinogen, Ur: 0.2 mg/dL (ref 0.2–1.0)
pH, UA: 5 (ref 5.0–7.5)

## 2020-03-07 MED ORDER — BCG LIVE 50 MG IS SUSR
3.2400 mL | Freq: Once | INTRAVESICAL | Status: AC
  Administered 2020-03-07: 81 mg via INTRAVESICAL

## 2020-03-07 NOTE — Progress Notes (Signed)
BCG Bladder Instillation  BCG # 5 of 6  Due to Bladder Cancer patient is present today for a BCG treatment. Patient was cleaned and prepped in a sterile fashion with betadine. A 14FR catheter was inserted, urine return was noted 23ml, urine was yellow in color.  21ml of reconstituted BCG was instilled into the bladder. The catheter was then removed. Patient tolerated well, no complications were noted  Performed by: Alexzandra Bilton,LPN  Follow up/ Additional notes: Keep next scheduled NV.

## 2020-03-07 NOTE — Patient Instructions (Signed)

## 2020-03-14 ENCOUNTER — Other Ambulatory Visit: Payer: Self-pay

## 2020-03-14 ENCOUNTER — Ambulatory Visit (INDEPENDENT_AMBULATORY_CARE_PROVIDER_SITE_OTHER): Payer: Medicare PPO

## 2020-03-14 DIAGNOSIS — C672 Malignant neoplasm of lateral wall of bladder: Secondary | ICD-10-CM | POA: Diagnosis not present

## 2020-03-14 LAB — URINALYSIS, ROUTINE W REFLEX MICROSCOPIC
Bilirubin, UA: NEGATIVE
Glucose, UA: NEGATIVE
Ketones, UA: NEGATIVE
Leukocytes,UA: NEGATIVE
Nitrite, UA: NEGATIVE
Protein,UA: NEGATIVE
RBC, UA: NEGATIVE
Specific Gravity, UA: 1.025 (ref 1.005–1.030)
Urobilinogen, Ur: 0.2 mg/dL (ref 0.2–1.0)
pH, UA: 5.5 (ref 5.0–7.5)

## 2020-03-14 MED ORDER — BCG LIVE 50 MG IS SUSR
3.2400 mL | Freq: Once | INTRAVESICAL | Status: AC
  Administered 2020-03-14: 81 mg via INTRAVESICAL

## 2020-03-14 NOTE — Patient Instructions (Signed)

## 2020-03-14 NOTE — Progress Notes (Signed)
BCG Bladder Instillation  BCG # 6 of 6  Due to Bladder Cancer patient is present today for a BCG treatment. Patient was cleaned and prepped in a sterile fashion with betadine. A 14FR catheter was inserted, urine return was noted 55ml, urine was yellow in color.  63ml of reconstituted BCG was instilled into the bladder. The catheter was then removed. Patient tolerated well, no complications were noted  Preformed by: Valentina Lucks, LPN

## 2020-04-11 ENCOUNTER — Ambulatory Visit (INDEPENDENT_AMBULATORY_CARE_PROVIDER_SITE_OTHER): Payer: Medicare PPO | Admitting: Urology

## 2020-04-11 ENCOUNTER — Other Ambulatory Visit: Payer: Medicare PPO | Admitting: Urology

## 2020-04-11 ENCOUNTER — Encounter: Payer: Self-pay | Admitting: Urology

## 2020-04-11 ENCOUNTER — Other Ambulatory Visit: Payer: Self-pay

## 2020-04-11 VITALS — BP 169/95 | HR 105 | Temp 98.4°F | Ht 68.0 in | Wt 200.0 lb

## 2020-04-11 DIAGNOSIS — C672 Malignant neoplasm of lateral wall of bladder: Secondary | ICD-10-CM

## 2020-04-11 LAB — URINALYSIS, ROUTINE W REFLEX MICROSCOPIC
Bilirubin, UA: NEGATIVE
Glucose, UA: NEGATIVE
Ketones, UA: NEGATIVE
Leukocytes,UA: NEGATIVE
Nitrite, UA: NEGATIVE
Protein,UA: NEGATIVE
RBC, UA: NEGATIVE
Specific Gravity, UA: 1.025 (ref 1.005–1.030)
Urobilinogen, Ur: 1 mg/dL (ref 0.2–1.0)
pH, UA: 6.5 (ref 5.0–7.5)

## 2020-04-11 MED ORDER — CIPROFLOXACIN HCL 500 MG PO TABS
500.0000 mg | ORAL_TABLET | Freq: Once | ORAL | Status: AC
  Administered 2020-04-11: 500 mg via ORAL

## 2020-04-11 NOTE — Patient Instructions (Signed)
Bladder Cancer  Bladder cancer is an abnormal growth of tissue in the bladder. The bladder is the balloon-like sac in the pelvis. It collects and stores urine that comes from the kidneys through the ureters. The bladder wall is made of layers. If cancer spreads into these layers and through the wall of the bladder, it becomes more difficult to treat. What are the causes? The cause of this condition is not known. What increases the risk? The following factors may make you more likely to develop this condition:  Smoking.  Workplace risks (occupational exposures), such as rubber, leather, textile, dyes, chemicals, and paint.  Being white.  Your age. Most people with bladder cancer are over the age of 55.  Being female.  Having chronic bladder inflammation.  Having a personal history of bladder cancer.  Having a family history of bladder cancer (heredity).  Having had chemotherapy or radiation therapy to the pelvis.  Having been exposed to arsenic. What are the signs or symptoms? Initial symptoms of this condition include:  Blood in the urine.  Painful urination.  Frequent bladder or urine infections.  Increase in urgency and frequency of urination. Advanced symptoms of this condition include:  Not being able to urinate.  Low back pain on one side.  Loss of appetite.  Weight loss.  Fatigue.  Swelling in the feet.  Bone pain. How is this diagnosed? This condition is diagnosed based on your medical history, a physical exam, urine tests, lab tests, imaging tests, and your symptoms. You may also have other tests or procedures done, such as:  A narrow tube being inserted into your bladder through your urethra (cystoscopy) in order to view the lining of your bladder for tumors.  A biopsy to sample the tumor to see if cancer is present. If cancer is present, it will then be staged to determine its severity and extent. Staging is an assessment of:  The size of the  tumor.  Whether the cancer has spread.  Where the cancer has spread. It is important to know how deeply into the bladder wall cancer has grown and whether cancer has spread to any other parts of your body. Staging may require blood tests or imaging tests, such as a CT scan, MRI, bone scan, or chest X-ray. How is this treated? Based on the stage of cancer, one treatment or a combination of treatments may be recommended. The most common forms of treatment are:  Surgery to remove the cancer. Procedures that may be done include transurethral resection and cystectomy.  Radiation therapy. This is high-energy X-rays or other particles. This is often used in combination with chemotherapy.  Chemotherapy. During this treatment, medicines are used to kill cancer cells.  Immunotherapy. This uses medicines to help your own immune system destroy cancer cells. Follow these instructions at home:  Take over-the-counter and prescription medicines only as told by your health care provider.  Maintain a healthy diet. Some of your treatments might affect your appetite.  Consider joining a support group. This may help you learn to cope with the stress of having bladder cancer.  Tell your cancer care team if you develop side effects. They may be able to recommend ways to relieve them.  Keep all follow-up visits as told by your health care provider. This is important. Where to find more information  American Cancer Society: www.cancer.org  National Cancer Institute (NCI): www.cancer.gov Contact a health care provider if:  You have symptoms of a urinary tract infection. These include: ?   Fever. ? Chills. ? Weakness. ? Muscle aches. ? Abdominal pain. ? Frequent and intense urge to urinate. ? Burning feeling in the bladder or urethra during urination. Get help right away if:  There is blood in your urine.  You cannot urinate.  You have severe pain or other symptoms that do not go  away. Summary  Bladder cancer is an abnormal growth of tissue in the bladder.  This condition is diagnosed based on your medical history, a physical exam, urine tests, lab tests, imaging tests, and your symptoms.  Based on the stage of cancer, surgery, chemotherapy, or a combination of treatments may be recommended.  Consider joining a support group. This may help you learn to cope with the stress of having bladder cancer. This information is not intended to replace advice given to you by your health care provider. Make sure you discuss any questions you have with your health care provider. Document Revised: 06/06/2017 Document Reviewed: 05/28/2016 Elsevier Patient Education  2020 [employer]  

## 2020-04-11 NOTE — Progress Notes (Signed)

## 2020-04-11 NOTE — Progress Notes (Signed)
° °  04/11/20  CC: hx of bladder cancer  HPI: Peggy Hines is a 69yo here for followup for high grade bladder cancer. No hematuria or dysuria Blood pressure (!) 169/95, pulse (!) 105, temperature 98.4 F (36.9 C), height 5\' 8"  (1.727 m), weight 200 lb (90.7 kg). NED. A&Ox3.   No respiratory distress   Abd soft, NT, ND Normal external genitalia with patent urethral meatus  Cystoscopy Procedure Note  Patient identification was confirmed, informed consent was obtained, and patient was prepped using Betadine solution.  Lidocaine jelly was administered per urethral meatus.    Procedure: - Flexible cystoscope introduced, without any difficulty.   - Thorough search of the bladder revealed:    normal urethral meatus    normal urothelium    no stones    no ulcers     39mm lesion at left ureteral orifice    no urethral polyps    no trabeculation  - Ureteral orifices were normal in position and appearance.  Post-Procedure: - Patient tolerated the procedure well  Assessment/ Plan:    Return in about 3 months (around 07/12/2020) for cystoscopy.  [name], MD

## 2020-04-13 ENCOUNTER — Ambulatory Visit: Payer: Medicare PPO | Attending: Internal Medicine

## 2020-04-13 DIAGNOSIS — Z23 Encounter for immunization: Secondary | ICD-10-CM

## 2020-04-13 NOTE — Progress Notes (Signed)
   Covid-19 Vaccination Clinic  Name:  Peggy Hines    MRN: [id number] DOB: Nov 29, 1950  04/13/2020  Ms. Dial was observed post Covid-19 immunization for 15 minutes without incident. She was provided with Vaccine Information Sheet and instruction to access the V-Safe system.   [name] was instructed to call 911 with any severe reactions post vaccine: Marland Kitchen Difficulty breathing  . Swelling of face and throat  . A fast heartbeat  . A bad rash all over body  . Dizziness and weakness      Covid-19 Vaccination Clinic  Name:  Peggy Hines    MRN: [id number] DOB: July 04, 1951  04/13/2020  [name] was observed post Covid-19 immunization for 15 minutes without incident. She was provided with Vaccine Information Sheet and instruction to access the V-Safe system.   [name] was instructed to call 911 with any severe reactions post vaccine: Marland Kitchen Difficulty breathing  . Swelling of face and throat  . A fast heartbeat  . A bad rash all over body  . Dizziness and weakness

## 2020-04-27 DIAGNOSIS — L121 Cicatricial pemphigoid: Secondary | ICD-10-CM | POA: Diagnosis not present

## 2020-04-27 DIAGNOSIS — D84821 Immunodeficiency due to drugs: Secondary | ICD-10-CM | POA: Diagnosis not present

## 2020-04-27 DIAGNOSIS — Z79899 Other long term (current) drug therapy: Secondary | ICD-10-CM | POA: Diagnosis not present

## 2020-05-10 DIAGNOSIS — Z79899 Other long term (current) drug therapy: Secondary | ICD-10-CM | POA: Diagnosis not present

## 2020-05-10 DIAGNOSIS — D84821 Immunodeficiency due to drugs: Secondary | ICD-10-CM | POA: Diagnosis not present

## 2020-05-23 DIAGNOSIS — L121 Cicatricial pemphigoid: Secondary | ICD-10-CM | POA: Diagnosis not present

## 2020-05-23 DIAGNOSIS — Z79899 Other long term (current) drug therapy: Secondary | ICD-10-CM | POA: Diagnosis not present

## 2020-06-06 DIAGNOSIS — L121 Cicatricial pemphigoid: Secondary | ICD-10-CM | POA: Diagnosis not present

## 2020-07-12 ENCOUNTER — Encounter: Payer: Self-pay | Admitting: Urology

## 2020-07-12 ENCOUNTER — Ambulatory Visit (INDEPENDENT_AMBULATORY_CARE_PROVIDER_SITE_OTHER): Payer: Medicare PPO | Admitting: Urology

## 2020-07-12 ENCOUNTER — Other Ambulatory Visit: Payer: Self-pay

## 2020-07-12 VITALS — BP 167/84 | HR 101 | Temp 97.9°F | Ht 68.0 in | Wt 200.0 lb

## 2020-07-12 DIAGNOSIS — C672 Malignant neoplasm of lateral wall of bladder: Secondary | ICD-10-CM | POA: Diagnosis not present

## 2020-07-12 LAB — POCT URINALYSIS DIPSTICK
Bilirubin, UA: NEGATIVE
Blood, UA: NEGATIVE
Glucose, UA: NEGATIVE
Ketones, UA: NEGATIVE
Leukocytes, UA: NEGATIVE
Nitrite, UA: NEGATIVE
Protein, UA: NEGATIVE
Spec Grav, UA: >=1.03 — AB (ref 1.010–1.025)
Urobilinogen, UA: 0.2 E.U./dL
pH, UA: 5.5 (ref 5.0–8.0)

## 2020-07-12 MED ORDER — CIPROFLOXACIN HCL 500 MG PO TABS
500.0000 mg | ORAL_TABLET | Freq: Once | ORAL | Status: AC
  Administered 2020-07-12: 500 mg via ORAL

## 2020-07-12 NOTE — Progress Notes (Signed)

## 2020-07-12 NOTE — Progress Notes (Signed)
   07/12/20  CC: followup bladder cancer  HPI: Ms Brandau is a 69yo here for followup for high grade bladder cancer. She complete 6 weeks of BCG in August 2021. She will start maintenence BCG in 1 month  Blood pressure (!) 167/84, pulse (!) 101, temperature 97.9 F (36.6 C), height 5\' 8"  (1.727 m), weight 200 lb (90.7 kg). NED. A&Ox3.   No respiratory distress   Abd soft, NT, ND Normal external genitalia with patent urethral meatus  Cystoscopy Procedure Note  Patient identification was confirmed, informed consent was obtained, and patient was prepped using Betadine solution.  Lidocaine jelly was administered per urethral meatus.    Procedure: - Flexible cystoscope introduced, without any difficulty.   - Thorough search of the bladder revealed:    normal urethral meatus    normal urothelium    no stones    no ulcers     no tumors    no urethral polyps    no trabeculation  - Ureteral orifices were normal in position and appearance.  Post-Procedure: - Patient tolerated the procedure well  Assessment/ Plan:    Return in about 4 weeks (around 08/09/2020) for to start 3 week course of maintenence BCG. Followup 3 months with cystoscopy.  10/07/2020, MD

## 2020-07-14 ENCOUNTER — Other Ambulatory Visit: Payer: Medicare PPO

## 2020-07-14 ENCOUNTER — Other Ambulatory Visit: Payer: Self-pay

## 2020-07-14 DIAGNOSIS — Z20822 Contact with and (suspected) exposure to covid-19: Secondary | ICD-10-CM

## 2020-07-17 LAB — NOVEL CORONAVIRUS, NAA: SARS-CoV-2, NAA: NOT DETECTED

## 2020-08-09 ENCOUNTER — Other Ambulatory Visit: Payer: Self-pay

## 2020-08-09 ENCOUNTER — Ambulatory Visit (INDEPENDENT_AMBULATORY_CARE_PROVIDER_SITE_OTHER): Payer: Medicare PPO

## 2020-08-09 DIAGNOSIS — C672 Malignant neoplasm of lateral wall of bladder: Secondary | ICD-10-CM | POA: Diagnosis not present

## 2020-08-09 LAB — URINALYSIS, ROUTINE W REFLEX MICROSCOPIC
Bilirubin, UA: NEGATIVE
Glucose, UA: NEGATIVE
Ketones, UA: NEGATIVE
Leukocytes,UA: NEGATIVE
Nitrite, UA: NEGATIVE
Protein,UA: NEGATIVE
RBC, UA: NEGATIVE
Specific Gravity, UA: 1.025 (ref 1.005–1.030)
Urobilinogen, Ur: 0.2 mg/dL (ref 0.2–1.0)
pH, UA: 5 (ref 5.0–7.5)

## 2020-08-09 MED ORDER — BCG LIVE 50 MG IS SUSR
3.2400 mL | Freq: Once | INTRAVESICAL | Status: AC
  Administered 2020-08-09: 81 mg via INTRAVESICAL

## 2020-08-09 NOTE — Patient Instructions (Signed)

## 2020-08-09 NOTE — Progress Notes (Signed)
BCG Bladder Instillation  BCG # 1  Due to Bladder Cancer patient is present today for a BCG treatment. Patient was cleaned and prepped in a sterile fashion with betadine. A 14FR catheter was inserted, urine return was noted 66ml, urine was yellow in color.  5ml of reconstituted BCG was instilled into the bladder. The catheter was then removed. Patient tolerated well, no complications were noted  Preformed by: Estill Bamberg RN  Follow up/ Additional notes: return for next BCG

## 2020-08-14 DIAGNOSIS — L121 Cicatricial pemphigoid: Secondary | ICD-10-CM | POA: Diagnosis not present

## 2020-08-14 DIAGNOSIS — Z79899 Other long term (current) drug therapy: Secondary | ICD-10-CM | POA: Diagnosis not present

## 2020-08-14 DIAGNOSIS — D849 Immunodeficiency, unspecified: Secondary | ICD-10-CM | POA: Diagnosis not present

## 2020-08-16 ENCOUNTER — Ambulatory Visit: Payer: Medicare PPO

## 2020-08-18 ENCOUNTER — Ambulatory Visit (INDEPENDENT_AMBULATORY_CARE_PROVIDER_SITE_OTHER): Payer: Medicare PPO

## 2020-08-18 ENCOUNTER — Other Ambulatory Visit: Payer: Self-pay

## 2020-08-18 DIAGNOSIS — C672 Malignant neoplasm of lateral wall of bladder: Secondary | ICD-10-CM

## 2020-08-18 LAB — URINALYSIS, ROUTINE W REFLEX MICROSCOPIC
Bilirubin, UA: NEGATIVE
Glucose, UA: NEGATIVE
Ketones, UA: NEGATIVE
Leukocytes,UA: NEGATIVE
Nitrite, UA: NEGATIVE
Protein,UA: NEGATIVE
RBC, UA: NEGATIVE
Specific Gravity, UA: 1.025 (ref 1.005–1.030)
Urobilinogen, Ur: 0.2 mg/dL (ref 0.2–1.0)
pH, UA: 5.5 (ref 5.0–7.5)

## 2020-08-18 MED ORDER — BCG LIVE 50 MG IS SUSR
3.2400 mL | Freq: Once | INTRAVESICAL | Status: AC
  Administered 2020-08-18: 81 mg via INTRAVESICAL

## 2020-08-18 NOTE — Progress Notes (Signed)
BCG Bladder Instillation  BCG # 2  Due to Bladder Cancer patient is present today for a BCG treatment. Patient was cleaned and prepped in a sterile fashion with betadine. A 14FR catheter was inserted, urine return was noted 31ml, urine was yellow in color.  44ml of reconstituted BCG was instilled into the bladder. The catheter was then removed. Patient tolerated well, no complications were noted  Preformed by: Park Pope RN  Follow up/ Additional notes: 1 week BCG

## 2020-08-18 NOTE — Patient Instructions (Signed)

## 2020-08-24 ENCOUNTER — Ambulatory Visit (INDEPENDENT_AMBULATORY_CARE_PROVIDER_SITE_OTHER): Payer: Medicare PPO

## 2020-08-24 ENCOUNTER — Other Ambulatory Visit: Payer: Self-pay

## 2020-08-24 DIAGNOSIS — C672 Malignant neoplasm of lateral wall of bladder: Secondary | ICD-10-CM

## 2020-08-24 LAB — URINALYSIS, ROUTINE W REFLEX MICROSCOPIC
Bilirubin, UA: NEGATIVE
Glucose, UA: NEGATIVE
Ketones, UA: NEGATIVE
Leukocytes,UA: NEGATIVE
Nitrite, UA: NEGATIVE
Protein,UA: NEGATIVE
RBC, UA: NEGATIVE
Specific Gravity, UA: 1.02 (ref 1.005–1.030)
Urobilinogen, Ur: 0.2 mg/dL (ref 0.2–1.0)
pH, UA: 6.5 (ref 5.0–7.5)

## 2020-08-24 MED ORDER — BCG LIVE 50 MG IS SUSR
3.2400 mL | Freq: Once | INTRAVESICAL | Status: AC
  Administered 2020-08-24: 81 mg via INTRAVESICAL

## 2020-08-24 NOTE — Progress Notes (Signed)
BCG Bladder Instillation  BCG # 3 of 3  Due to Bladder Cancer patient is present today for a BCG treatment. Patient was cleaned and prepped in a sterile fashion with betadine. A 14FR catheter was inserted, urine return was noted 71ml, urine was yellow in color.  16ml of reconstituted BCG was instilled into the bladder. The catheter was then removed. Patient tolerated well, no complications were noted  Performed by: Jordan Pardini, lpn  Follow up/ Additional notes: Keep next scheduled OV

## 2020-08-24 NOTE — Patient Instructions (Signed)

## 2020-08-28 ENCOUNTER — Telehealth: Payer: Medicare PPO | Admitting: Nurse Practitioner

## 2020-08-28 DIAGNOSIS — J0100 Acute maxillary sinusitis, unspecified: Secondary | ICD-10-CM | POA: Diagnosis not present

## 2020-08-28 MED ORDER — AMOXICILLIN-POT CLAVULANATE 875-125 MG PO TABS: 1.0000 | ORAL_TABLET | Freq: Two times a day (BID) | ORAL | 0 refills | Status: DC

## 2020-08-28 NOTE — Progress Notes (Signed)

## 2020-09-21 DIAGNOSIS — I788 Other diseases of capillaries: Secondary | ICD-10-CM | POA: Diagnosis not present

## 2020-10-11 ENCOUNTER — Encounter: Payer: Self-pay | Admitting: Urology

## 2020-10-11 ENCOUNTER — Ambulatory Visit (INDEPENDENT_AMBULATORY_CARE_PROVIDER_SITE_OTHER): Payer: Medicare PPO | Admitting: Urology

## 2020-10-11 ENCOUNTER — Other Ambulatory Visit: Payer: Self-pay

## 2020-10-11 VITALS — BP 153/84 | HR 90 | Temp 98.0°F | Ht 68.0 in | Wt 200.0 lb

## 2020-10-11 DIAGNOSIS — C672 Malignant neoplasm of lateral wall of bladder: Secondary | ICD-10-CM

## 2020-10-11 LAB — URINALYSIS, ROUTINE W REFLEX MICROSCOPIC
Bilirubin, UA: NEGATIVE
Glucose, UA: NEGATIVE
Ketones, UA: NEGATIVE
Leukocytes,UA: NEGATIVE
Nitrite, UA: NEGATIVE
Protein,UA: NEGATIVE
RBC, UA: NEGATIVE
Specific Gravity, UA: 1.025 (ref 1.005–1.030)
Urobilinogen, Ur: 0.2 mg/dL (ref 0.2–1.0)
pH, UA: 5.5 (ref 5.0–7.5)

## 2020-10-11 MED ORDER — CIPROFLOXACIN HCL 500 MG PO TABS
500.0000 mg | ORAL_TABLET | Freq: Once | ORAL | Status: AC
Start: 2020-10-11 — End: 2020-10-11
  Administered 2020-10-11: 500 mg via ORAL

## 2020-10-11 NOTE — Progress Notes (Signed)
   10/11/20  CC:  Chief Complaint  Patient presents with  . Cysto    HPI: Ms Hagger is a 70yo here for followup for high grade bladder cancer. She complete a 3 month maintenance BCG 2 months ago.  Blood pressure (!) 153/84, pulse 90, temperature 98 F (36.7 C), height 5\' 8"  (1.727 m), weight 200 lb (90.7 kg). NED. A&Ox3.   No respiratory distress   Abd soft, NT, ND Normal external genitalia with patent urethral meatus  Cystoscopy Procedure Note  Patient identification was confirmed, informed consent was obtained, and patient was prepped using Betadine solution.  Lidocaine jelly was administered per urethral meatus.    Procedure: - Flexible cystoscope introduced, without any difficulty.   - Thorough search of the bladder revealed:    normal urethral meatus    normal urothelium    no stones    no ulcers     no tumors    no urethral polyps    no trabeculation  - Ureteral orifices were normal in position and appearance.  Post-Procedure: - Patient tolerated the procedure well  Assessment/ Plan: Maintenance BCG in 2 months. RTC 4 months for cystoscopy   No follow-ups on file.  [name], MD

## 2020-10-11 NOTE — Patient Instructions (Addendum)
Bladder Cancer  Bladder cancer is a condition in which abnormal tissue (a tumor) grows in the bladder. The bladder is the organ that holds urine. Two tubes (ureters) carry the urine from the kidneys to the bladder. The bladder wall is made of layers of tissue. Cancer that spreads through these layers of the bladder wall becomes more difficult to treat. What are the causes? The cause of this condition is not known. What increases the risk? The following factors may make you more likely to develop this condition:  Smoking.  Working where there are risks (occupational exposures), such as working with rubber, leather, clothing fabric, dyes, chemicals, and paint.  Being 71 years of age or older.  Being female.  Having bladder inflammation that is long-term (chronic).  Having a history of cancer, including: ? A family history of bladder cancer. ? Personal experience with bladder cancer. ? Having had certain treatments for cancer before. These include:  Medicines to kill cancer cells (chemotherapy).  Strong X-ray beams or capsules high in energy to kill cancer cells and shrink tumors (radiation therapy).  Having been exposed to arsenic. This is a chemical element that can poison you. What are the signs or symptoms? Early symptoms of this condition include:  Seeing blood in your urine.  Feeling pain when urinating.  Having infections of your urinary system (urinary tract infections or UTIs) that happen often.  Having to urinate sooner or more often than usual. Later symptoms of this condition include:  Not being able to urinate.  Pain on one side of your lower back.  Loss of appetite.  Weight loss.  Tiredness (fatigue).  Swelling in your feet.  Bone pain. How is this diagnosed? This condition is diagnosed based on:  Your medical history.  A physical exam.  Lab tests, such as urine tests.  Imaging tests.  Your symptoms. You may also have other tests or  procedures done, such as:  A cystoscopy. A narrow tube is inserted into your bladder through the organ that connects your bladder to the outside of your body (urethra). This is done to view the lining of your bladder for tumors.  A biopsy. This procedure involves removing a tissue sample to look at it under a microscope to see if cancer is present. It is important to find out:  How deeply into the bladder wall cancer has grown.  Whether cancer has spread to any other parts of your body. This may require blood tests or imaging tests, such as a CT scan, MRI, bone scan, or X-rays. How is this treated? Your health care provider may recommend one or more types of treatment based on the stage of your cancer. The most common types of treatment are:  Surgery to remove the cancer. Procedures that may be done include: ? Removing a tumor on the inside wall of the bladder (transurethral resection). ? Removing the bladder (cystectomy).  Radiation therapy. This is often used together with chemotherapy.  Chemotherapy.  Immunotherapy. This uses medicines to help your immune system destroy cancer cells. Follow these instructions at home:  Take over-the-counter and prescription medicines only as told by your health care provider.  Eat a healthy diet. Some of your treatments might affect your appetite.  Do not use any products that contain nicotine or tobacco, such as cigarettes, e-cigarettes, and chewing tobacco. If you need help quitting, ask your health care provider.  Consider joining a support group. This may help you learn to cope with the stress of having   bladder cancer.  Tell your cancer care team if you develop side effects. Your team may be able to recommend ways to get relief.  Keep all follow-up visits as told by your health care provider. This is important. Where to find more information  American Cancer Society: www.cancer.[place] ([place]):  www.cancer.gov Contact a health care provider if:  You have symptoms of a urinary tract infection. These include: ? Fever. ? Chills. ? Weakness. ? Muscle aches. ? Pain in your abdomen. ? Urge to urinate that is stronger and happens more often than usual. ? Burning feeling in the bladder or urethra when you urinate. Get help right away if:  There is blood in your urine.  You cannot urinate.  You have severe pain or other symptoms that do not go away. Summary  Bladder cancer is a condition in which tumors grow in the bladder and cause illness.  This condition is diagnosed based on your medical history, a physical exam, lab tests, imaging tests, and your symptoms.  Your health care provider may recommend one or more types of treatment based on the stage of your cancer.  Consider joining a support group. This may help you learn to cope with the stress of having bladder cancer. This information is not intended to replace advice given to you by your health care provider. Make sure you discuss any questions you have with your health care provider. Document Revised: 03/03/2019 Document Reviewed: 03/03/2019 Elsevier Patient Education  2021 [place].  Bladder Cancer  Bladder cancer is a condition in which abnormal tissue (a tumor) grows in the bladder. The bladder is the organ that holds urine. Two tubes (ureters) carry the urine from the kidneys to the bladder. The bladder wall is made of layers of tissue. Cancer that spreads through these layers of the bladder wall becomes more difficult to treat. What are the causes? The cause of this condition is not known. What increases the risk? The following factors may make you more likely to develop this condition:  Smoking.  Working where there are risks (occupational exposures), such as working with Engineer, structural, Brewing technologist, clothing fabric, dyes, chemicals, and paint.  Being 71 years of age or older.  Being female.  Having bladder  inflammation that is long-term (chronic).  Having a history of cancer, including: ? A family history of bladder cancer. ? Personal experience with bladder cancer. ? Having had certain treatments for cancer before. These include:  Medicines to kill cancer cells (chemotherapy).  Strong X-ray beams or capsules high in energy to kill cancer cells and shrink tumors (radiation therapy).  Having been exposed to arsenic. This is a Financial risk analyst that can poison you. What are the signs or symptoms? Early symptoms of this condition include:  Seeing blood in your urine.  Feeling pain when urinating.  Having infections of your urinary system (urinary tract infections or UTIs) that happen often.  Having to urinate sooner or more often than usual. Later symptoms of this condition include:  Not being able to urinate.  Pain on one side of your lower back.  Loss of appetite.  Weight loss.  Tiredness (fatigue).  Swelling in your feet.  Bone pain. How is this diagnosed? This condition is diagnosed based on:  Your medical history.  A physical exam.  Lab tests, such as urine tests.  Imaging tests.  Your symptoms. You may also have other tests or procedures done, such as:  A cystoscopy. A narrow tube is inserted into  your bladder through the organ that connects your bladder to the outside of your body (urethra). This is done to view the lining of your bladder for tumors.  A biopsy. This procedure involves removing a tissue sample to look at it under a microscope to see if cancer is present. It is important to find out:  How deeply into the bladder wall cancer has grown.  Whether cancer has spread to any other parts of your body. This may require blood tests or imaging tests, such as a CT scan, MRI, bone scan, or X-rays. How is this treated? Your health care provider may recommend one or more types of treatment based on the stage of your cancer. The most common types of  treatment are:  Surgery to remove the cancer. Procedures that may be done include: ? Removing a tumor on the inside wall of the bladder (transurethral resection). ? Removing the bladder (cystectomy).  Radiation therapy. This is often used together with chemotherapy.  Chemotherapy.  Immunotherapy. This uses medicines to help your immune system destroy cancer cells. Follow these instructions at home:  Take over-the-counter and prescription medicines only as told by your health care provider.  Eat a healthy diet. Some of your treatments might affect your appetite.  Do not use any products that contain nicotine or tobacco, such as cigarettes, e-cigarettes, and chewing tobacco. If you need help quitting, ask your health care provider.  Consider joining a support group. This may help you learn to cope with the stress of having bladder cancer.  Tell your cancer care team if you develop side effects. Your team may be able to recommend ways to get relief.  Keep all follow-up visits as told by your health care provider. This is important. Where to find more information  American Cancer Society: www.cancer.[place] www.cancer.gov Contact a health care provider if:  You have symptoms of a urinary tract infection. These include: ? Fever. ? Chills. ? Weakness. ? Muscle aches. ? Pain in your abdomen. ? Urge to urinate that is stronger and happens more often than usual. ? Burning feeling in the bladder or urethra when you urinate. Get help right away if:  There is blood in your urine.  You cannot urinate.  You have severe pain or other symptoms that do not go away. Summary  Bladder cancer is a condition in which tumors grow in the bladder and cause illness.  This condition is diagnosed based on your medical history, a physical exam, lab tests, imaging tests, and your symptoms.  Your health care provider may recommend one or more types of treatment  based on the stage of your cancer.  Consider joining a support group. This may help you learn to cope with the stress of having bladder cancer. This information is not intended to replace advice given to you by your health care provider. Make sure you discuss any questions you have with your health care provider. Document Revised: 03/03/2019 Document Reviewed: 03/03/2019 Elsevier Patient Education  [place].

## 2020-10-11 NOTE — Progress Notes (Signed)
Urological Symptom Review  Patient is experiencing the following symptoms: None listed   Review of Systems  Gastrointestinal (upper)  : Negative for upper GI symptoms  Gastrointestinal (lower) : Negative for lower GI symptoms  Constitutional : Negative for symptoms  Skin: Negative for skin symptoms  Eyes: Negative for eye symptoms  Ear/Nose/Throat : Sinus problems  Hematologic/Lymphatic: Negative for Hematologic/Lymphatic symptoms  Cardiovascular : Negative for cardiovascular symptoms  Respiratory : Negative for respiratory symptoms  Endocrine: Negative for endocrine symptoms  Musculoskeletal: Negative for musculoskeletal symptoms  Neurological: Negative for neurological symptoms  Psychologic: Negative for psychiatric symptoms

## 2020-12-01 DIAGNOSIS — L84 Corns and callosities: Secondary | ICD-10-CM | POA: Diagnosis not present

## 2020-12-01 DIAGNOSIS — Z23 Encounter for immunization: Secondary | ICD-10-CM | POA: Diagnosis not present

## 2020-12-01 DIAGNOSIS — Z298 Encounter for other specified prophylactic measures: Secondary | ICD-10-CM | POA: Diagnosis not present

## 2020-12-01 DIAGNOSIS — D84821 Immunodeficiency due to drugs: Secondary | ICD-10-CM | POA: Diagnosis not present

## 2020-12-01 DIAGNOSIS — L121 Cicatricial pemphigoid: Secondary | ICD-10-CM | POA: Diagnosis not present

## 2020-12-01 DIAGNOSIS — Z79899 Other long term (current) drug therapy: Secondary | ICD-10-CM | POA: Diagnosis not present

## 2020-12-11 ENCOUNTER — Ambulatory Visit: Payer: Medicare PPO

## 2020-12-12 ENCOUNTER — Ambulatory Visit (INDEPENDENT_AMBULATORY_CARE_PROVIDER_SITE_OTHER): Payer: Medicare PPO

## 2020-12-12 ENCOUNTER — Other Ambulatory Visit: Payer: Self-pay

## 2020-12-12 DIAGNOSIS — C672 Malignant neoplasm of lateral wall of bladder: Secondary | ICD-10-CM | POA: Diagnosis not present

## 2020-12-12 LAB — URINALYSIS, ROUTINE W REFLEX MICROSCOPIC
Bilirubin, UA: NEGATIVE
Glucose, UA: NEGATIVE
Ketones, UA: NEGATIVE
Leukocytes,UA: NEGATIVE
Nitrite, UA: NEGATIVE
Protein,UA: NEGATIVE
RBC, UA: NEGATIVE
Specific Gravity, UA: 1.025 (ref 1.005–1.030)
Urobilinogen, Ur: 0.2 mg/dL (ref 0.2–1.0)
pH, UA: 6 (ref 5.0–7.5)

## 2020-12-12 MED ORDER — BCG LIVE 50 MG IS SUSR
3.2400 mL | Freq: Once | INTRAVESICAL | Status: AC
  Administered 2020-12-12: 40.5 mg via INTRAVESICAL

## 2020-12-12 NOTE — Progress Notes (Signed)
BCG Bladder Instillation  BCG # 1 of 3  Due to Bladder Cancer patient is present today for a BCG treatment. Patient was cleaned and prepped in a sterile fashion with betadine. A 14FR catheter was inserted, urine return was noted 57ml, urine was yellow in color.  39ml of reconstituted BCG was instilled into the bladder. The catheter was then removed. Patient tolerated well, no complications were noted  Preformed by: Estill Bamberg RN  Follow up/ Additional notes: 1 week NV

## 2020-12-18 ENCOUNTER — Other Ambulatory Visit: Payer: Self-pay

## 2020-12-18 ENCOUNTER — Ambulatory Visit (INDEPENDENT_AMBULATORY_CARE_PROVIDER_SITE_OTHER): Payer: Medicare PPO

## 2020-12-18 DIAGNOSIS — C672 Malignant neoplasm of lateral wall of bladder: Secondary | ICD-10-CM

## 2020-12-18 LAB — URINALYSIS, ROUTINE W REFLEX MICROSCOPIC
Bilirubin, UA: NEGATIVE
Glucose, UA: NEGATIVE
Ketones, UA: NEGATIVE
Leukocytes,UA: NEGATIVE
Nitrite, UA: NEGATIVE
Protein,UA: NEGATIVE
RBC, UA: NEGATIVE
Specific Gravity, UA: 1.02 (ref 1.005–1.030)
Urobilinogen, Ur: 0.2 mg/dL (ref 0.2–1.0)
pH, UA: 5.5 (ref 5.0–7.5)

## 2020-12-18 MED ORDER — BCG LIVE 50 MG IS SUSR
3.2400 mL | Freq: Once | INTRAVESICAL | Status: AC
Start: 2020-12-18 — End: 2020-12-18
  Administered 2020-12-18: 81 mg via INTRAVESICAL

## 2020-12-18 NOTE — Patient Instructions (Signed)

## 2020-12-18 NOTE — Progress Notes (Signed)
BCG Bladder Instillation  BCG # 2 of 3  Due to Bladder Cancer patient is present today for a BCG treatment. Patient was cleaned and prepped in a sterile fashion with betadine. A 14FR catheter was inserted, urine return was noted 77ml, urine was yellow in color.  28ml of reconstituted BCG was instilled into the bladder. The catheter was then removed. Patient tolerated well, no complications were noted  Performed by: Johnnye Sandford, LPN  Follow up/ Additional notes: Keep next schedule appointment

## 2020-12-25 ENCOUNTER — Other Ambulatory Visit: Payer: Self-pay

## 2020-12-25 ENCOUNTER — Ambulatory Visit (INDEPENDENT_AMBULATORY_CARE_PROVIDER_SITE_OTHER): Payer: Medicare PPO

## 2020-12-25 DIAGNOSIS — C672 Malignant neoplasm of lateral wall of bladder: Secondary | ICD-10-CM | POA: Diagnosis not present

## 2020-12-25 LAB — URINALYSIS, ROUTINE W REFLEX MICROSCOPIC
Bilirubin, UA: NEGATIVE
Glucose, UA: NEGATIVE
Ketones, UA: NEGATIVE
Leukocytes,UA: NEGATIVE
Nitrite, UA: NEGATIVE
Protein,UA: NEGATIVE
RBC, UA: NEGATIVE
Specific Gravity, UA: 1.025 (ref 1.005–1.030)
Urobilinogen, Ur: 0.2 mg/dL (ref 0.2–1.0)
pH, UA: 6 (ref 5.0–7.5)

## 2020-12-25 MED ORDER — BCG LIVE 50 MG IS SUSR
3.2400 mL | Freq: Once | INTRAVESICAL | Status: AC
  Administered 2020-12-25: 40.5 mg via INTRAVESICAL

## 2020-12-25 NOTE — Progress Notes (Signed)
BCG Bladder Instillation  BCG # 3  Due to Bladder Cancer patient is present today for a BCG treatment. Patient was cleaned and prepped in a sterile fashion with betadine. A 14FR catheter was inserted, urine return was noted 8ml, urine was yellow in color.  42ml of reconstituted BCG was instilled into the bladder. The catheter was then removed. Patient tolerated well, no complications were noted  Preformed by: Park Pope RN  Follow up/ Additional notes: keep scheduled office visit with MD

## 2021-01-11 DIAGNOSIS — B078 Other viral warts: Secondary | ICD-10-CM | POA: Diagnosis not present

## 2021-01-21 ENCOUNTER — Telehealth: Payer: Medicare PPO | Admitting: Orthopedic Surgery

## 2021-01-21 DIAGNOSIS — U071 COVID-19: Secondary | ICD-10-CM

## 2021-01-21 MED ORDER — NAPROXEN 500 MG PO TABS
500.0000 mg | ORAL_TABLET | Freq: Two times a day (BID) | ORAL | 0 refills | Status: DC
Start: 2021-01-21 — End: 2023-08-07

## 2021-01-21 MED ORDER — MOLNUPIRAVIR EUA 200MG CAPSULE: 4.0000 | ORAL_CAPSULE | Freq: Two times a day (BID) | ORAL | 0 refills | Status: AC

## 2021-01-21 NOTE — Progress Notes (Signed)
Virtual Visit Consent   Peggy Hines, you are scheduled for a virtual visit with a [facility] provider today.     Just as with appointments in the office, your consent must be obtained to participate.  Your consent will be active for this visit and any virtual visit you may have with one of our providers in the next 365 days.     If you have a MyChart account, a copy of this consent can be sent to you electronically.  All virtual visits are billed to your insurance company just like a traditional visit in the office.    As this is a virtual visit, video technology does not allow for your provider to perform a traditional examination.  This may limit your provider's ability to fully assess your condition.  If your provider identifies any concerns that need to be evaluated in person or the need to arrange testing (such as labs, EKG, etc.), we will make arrangements to do so.     Although advances in technology are sophisticated, we cannot ensure that it will always work on either your end or our end.  If the connection with a video visit is poor, the visit may have to be switched to a telephone visit.  With either a video or telephone visit, we are not always able to ensure that we have a secure connection.     I need to obtain your verbal consent now.   Are you willing to proceed with your visit today? Yes   Peggy Hines has provided verbal consent on 01/21/2021 for a virtual visit (video or telephone).   Lisette Abu, PA-C   Date: 01/21/2021 10:04 AM   Virtual Visit via Video Note   I, Lisette Abu, connected with  Peggy Hines  ([id number], 1950/07/09) on 01/21/21 at  9:30 AM EDT by a video-enabled telemedicine application and verified that I am speaking with the correct person using two identifiers.  Location: Patient: Virtual Visit Location Patient: Home Provider: Virtual Visit Location Provider: Home Office   I discussed the limitations of evaluation and  management by telemedicine and the availability of in person appointments. The patient expressed understanding and agreed to proceed.    History of Present Illness: Peggy Hines is a 70 y.o. who identifies as a female who was assigned female at birth, and is being seen today for Covid infection. She began to develop symptoms last night with headache, fevers, and chills. Also sore throat and taste perversion. She tested positive for Covid last night. Her husband tested positive earlier this week.  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 , Disp: , Rfl:    aspirin EC 81 MG tablet, Take 81 mg by mouth every evening. , Disp: , Rfl:    atorvastatin (LIPITOR) 20 MG tablet, Take 20 mg by mouth every evening. , Disp: , Rfl:    Cholecalciferol (VITAMIN D3) 50 MCG (2000 UT) TABS, Take 2,000 Units by mouth every evening., Disp: , Rfl:    estradiol (VIVELLE-DOT) 0.05 MG/24HR patch, Place 0.5 patches onto the  skin once a week. , Disp: , Rfl: 2   famotidine (PEPCID) 20 MG tablet, Take 20 mg by mouth at bedtime., Disp: , Rfl:    Multiple Vitamins-Minerals (PRESERVISION AREDS 2 PO), Take 1 tablet by mouth at bedtime., Disp: , Rfl:    Omega-3 Fatty Acids (FISH OIL) 1000 MG CAPS, Take 2,000 mg by mouth daily., Disp: , Rfl:    riTUXimab (RITUXAN IV), Inject 1 Dose into the vein every 6 (six) months., Disp: , Rfl:   Observations/Objective: Patient is well-developed, well-nourished in no acute distress.  Resting comfortably  at home.  Head is normocephalic, atraumatic.  No labored breathing.  Speech  is clear and coherent with logical content.  Patient is alert and oriented at baseline.    Assessment and Plan: 1. COVID -- Will prescribe molnupavir and Naproxen.    Follow Up Instructions: I discussed the assessment and treatment plan with the patient. The patient was provided an opportunity to ask questions and all were answered. The patient agreed with the plan and demonstrated an understanding of the instructions.  A copy of instructions were sent to the patient via MyChart.  The patient was advised to call back or seek an in-person evaluation if the symptoms worsen or if the condition fails to improve as anticipated.  Time:  I spent 15 minutes with the patient via telehealth technology discussing the above problems/concerns.    Lisette Abu, PA-C

## 2021-01-22 ENCOUNTER — Other Ambulatory Visit: Payer: Self-pay | Admitting: Infectious Diseases

## 2021-01-22 ENCOUNTER — Other Ambulatory Visit (HOSPITAL_COMMUNITY): Payer: Self-pay

## 2021-01-22 DIAGNOSIS — U071 COVID-19: Secondary | ICD-10-CM

## 2021-01-22 MED ORDER — NIRMATRELVIR/RITONAVIR (PAXLOVID) TABLET (RENAL DOSING)
2.0000 | ORAL_TABLET | Freq: Two times a day (BID) | ORAL | 0 refills | Status: AC
  Filled 2021-01-22: qty 20, 5d supply, fill #0

## 2021-01-29 ENCOUNTER — Other Ambulatory Visit: Payer: Self-pay | Admitting: Infectious Diseases

## 2021-01-29 DIAGNOSIS — U071 COVID-19: Secondary | ICD-10-CM

## 2021-01-29 MED ORDER — NIRMATRELVIR/RITONAVIR (PAXLOVID) TABLET (RENAL DOSING): 2.0000 | ORAL_TABLET | Freq: Two times a day (BID) | ORAL | 0 refills | Status: AC

## 2021-02-12 ENCOUNTER — Encounter: Payer: Self-pay | Admitting: Urology

## 2021-02-12 ENCOUNTER — Ambulatory Visit: Payer: Medicare PPO | Admitting: Urology

## 2021-02-12 ENCOUNTER — Other Ambulatory Visit: Payer: Self-pay

## 2021-02-12 VITALS — BP 144/63 | HR 93

## 2021-02-12 DIAGNOSIS — C672 Malignant neoplasm of lateral wall of bladder: Secondary | ICD-10-CM | POA: Diagnosis not present

## 2021-02-12 LAB — URINALYSIS, ROUTINE W REFLEX MICROSCOPIC
Bilirubin, UA: NEGATIVE
Glucose, UA: NEGATIVE
Ketones, UA: NEGATIVE
Nitrite, UA: NEGATIVE
Protein,UA: NEGATIVE
Specific Gravity, UA: 1.025 (ref 1.005–1.030)
Urobilinogen, Ur: 0.2 mg/dL (ref 0.2–1.0)
pH, UA: 6 (ref 5.0–7.5)

## 2021-02-12 LAB — MICROSCOPIC EXAMINATION: Renal Epithel, UA: NONE SEEN /hpf

## 2021-02-12 MED ORDER — CIPROFLOXACIN HCL 500 MG PO TABS
500.0000 mg | ORAL_TABLET | Freq: Once | ORAL | Status: AC
  Administered 2021-02-12: 500 mg via ORAL

## 2021-02-12 NOTE — Progress Notes (Signed)

## 2021-02-12 NOTE — Patient Instructions (Signed)
Bladder Cancer Bladder cancer is a condition in which abnormal tissue (a tumor) grows in the bladder. The bladder is the organ that holds urine. Two tubes (ureters) carry the urine from the kidneys to the bladder. The bladder wall is made of layers of tissue. Cancer that spreads through these layers of the bladder wall becomes more difficult to treat. What are the causes? The cause of this condition is not known. What increases the risk? The following factors may make you more likely to develop this condition: Smoking. Working where there are risks (occupational exposures), such as working with rubber, leather, clothing fabric, dyes, chemicals, and paint. Being 55 years of age or older. Being female. Having bladder inflammation that is long-term (chronic). Having a history of cancer, including: A family history of bladder cancer. Personal experience with bladder cancer. Having had certain treatments for cancer before. These include: Medicines to kill cancer cells (chemotherapy). Strong X-ray beams or capsules high in energy to kill cancer cells and shrink tumors (radiation therapy). Having been exposed to arsenic. This is a chemical element that can poison you. What are the signs or symptoms? Early symptoms of this condition include: Seeing blood in your urine. Feeling pain when urinating. Having infections of your urinary system (urinary tract infections or UTIs) that happen often. Having to urinate sooner or more often than usual. Later symptoms of this condition include: Not being able to urinate. Pain on one side of your lower back. Loss of appetite. Weight loss. Tiredness (fatigue). Swelling in your feet. Bone pain. How is this diagnosed? This condition is diagnosed based on: Your medical history. A physical exam. Lab tests, such as urine tests. Imaging tests. Your symptoms. You may also have other tests or procedures done, such as: A cystoscopy. A narrow tube is inserted  into your bladder through the organ that connects your bladder to the outside of your body (urethra). This is done to view the lining of your bladder for tumors. A biopsy. This procedure involves removing a tissue sample to look at it under a microscope to see if cancer is present. It is important to find out: How deeply into the bladder wall cancer has grown. Whether cancer has spread to any other parts of your body. This may require blood tests or imaging tests, such as a CT scan, MRI, bone scan, or X-rays. How is this treated? Your health care provider may recommend one or more types of treatment based on the stage of your cancer. The most common types of treatment are: Surgery to remove the cancer. Procedures that may be done include: Removing a tumor on the inside wall of the bladder (transurethral resection). Removing the bladder (cystectomy). Radiation therapy. This is often used together with chemotherapy. Chemotherapy. Immunotherapy. This uses medicines to help your immune system destroy cancer cells. Follow these instructions at home: Take over-the-counter and prescription medicines only as told by your health care provider. Eat a healthy diet. Some of your treatments might affect your appetite. Do not use any products that contain nicotine or tobacco, such as cigarettes, e-cigarettes, and chewing tobacco. If you need help quitting, ask your health care provider. Consider joining a support group. This may help you learn to cope with the stress of having bladder cancer. Tell your cancer care team if you develop side effects. Your team may be able to recommend ways to get relief. Keep all follow-up visits as told by your health care provider. This is important. Where to find more information American   Cancer Society: www.cancer.org National Cancer Institute (NCI): www.cancer.gov Contact a health care provider if: You have symptoms of a urinary tract infection. These  include: Fever. Chills. Weakness. Muscle aches. Pain in your abdomen. Urge to urinate that is stronger and happens more often than usual. Burning feeling in the bladder or urethra when you urinate. Get help right away if: There is blood in your urine. You cannot urinate. You have severe pain or other symptoms that do not go away. Summary Bladder cancer is a condition in which tumors grow in the bladder and cause illness. This condition is diagnosed based on your medical history, a physical exam, lab tests, imaging tests, and your symptoms. Your health care provider may recommend one or more types of treatment based on the stage of your cancer. Consider joining a support group. This may help you learn to cope with the stress of having bladder cancer. This information is not intended to replace advice given to you by your health care provider. Make sure you discuss any questions you have with your health care provider. Document Revised: 03/03/2019 Document Reviewed: 03/03/2019 Elsevier Patient Education  2022 [employer] Inc.  

## 2021-02-12 NOTE — Progress Notes (Signed)
   02/12/21  CC: followup high grade bladder cancer   HPI: Ms Dulong is a 70yo here for followup for high grade bladder cancer. Bladder tumor removed 1 year ago. She completed 6 month BCG in 12/2020. No worsening LUTS Blood pressure (!) 144/63, pulse 93. NED. A&Ox3.   No respiratory distress   Abd soft, NT, ND Normal external genitalia with patent urethral meatus  Cystoscopy Procedure Note  Patient identification was confirmed, informed consent was obtained, and patient was prepped using Betadine solution.  Lidocaine jelly was administered per urethral meatus.    Procedure: - Flexible cystoscope introduced, without any difficulty.   - Thorough search of the bladder revealed:    normal urethral meatus    normal urothelium    no stones    no ulcers     no tumors    no urethral polyps    no trabeculation  - Ureteral orifices were normal in position and appearance.  Post-Procedure: - Patient tolerated the procedure well  Assessment/ Plan: RTC 3 months for cystoscopy. If cystoscopy is clear she will have BCG maintenance in 06/2021   No follow-ups on file.  [name], MD

## 2021-02-16 DIAGNOSIS — R7309 Other abnormal glucose: Secondary | ICD-10-CM | POA: Diagnosis not present

## 2021-02-16 DIAGNOSIS — L121 Cicatricial pemphigoid: Secondary | ICD-10-CM | POA: Diagnosis not present

## 2021-02-16 DIAGNOSIS — Z1331 Encounter for screening for depression: Secondary | ICD-10-CM | POA: Diagnosis not present

## 2021-02-16 DIAGNOSIS — E6609 Other obesity due to excess calories: Secondary | ICD-10-CM | POA: Diagnosis not present

## 2021-02-16 DIAGNOSIS — Z0001 Encounter for general adult medical examination with abnormal findings: Secondary | ICD-10-CM | POA: Diagnosis not present

## 2021-02-16 DIAGNOSIS — Z6831 Body mass index (BMI) 31.0-31.9, adult: Secondary | ICD-10-CM | POA: Diagnosis not present

## 2021-02-16 DIAGNOSIS — E7849 Other hyperlipidemia: Secondary | ICD-10-CM | POA: Diagnosis not present

## 2021-03-01 ENCOUNTER — Other Ambulatory Visit: Payer: Self-pay | Admitting: Family Medicine

## 2021-03-01 DIAGNOSIS — Z1231 Encounter for screening mammogram for malignant neoplasm of breast: Secondary | ICD-10-CM

## 2021-03-02 DIAGNOSIS — L121 Cicatricial pemphigoid: Secondary | ICD-10-CM | POA: Diagnosis not present

## 2021-03-02 DIAGNOSIS — E6609 Other obesity due to excess calories: Secondary | ICD-10-CM | POA: Diagnosis not present

## 2021-03-02 DIAGNOSIS — Z1389 Encounter for screening for other disorder: Secondary | ICD-10-CM | POA: Diagnosis not present

## 2021-03-02 DIAGNOSIS — Z0001 Encounter for general adult medical examination with abnormal findings: Secondary | ICD-10-CM | POA: Diagnosis not present

## 2021-04-09 ENCOUNTER — Other Ambulatory Visit: Payer: Self-pay

## 2021-04-09 ENCOUNTER — Telehealth: Payer: Self-pay

## 2021-04-09 ENCOUNTER — Other Ambulatory Visit: Payer: Medicare PPO

## 2021-04-09 DIAGNOSIS — N3021 Other chronic cystitis with hematuria: Secondary | ICD-10-CM

## 2021-04-09 DIAGNOSIS — C672 Malignant neoplasm of lateral wall of bladder: Secondary | ICD-10-CM

## 2021-04-09 LAB — URINALYSIS, ROUTINE W REFLEX MICROSCOPIC
Bilirubin, UA: NEGATIVE
Glucose, UA: NEGATIVE
Ketones, UA: NEGATIVE
Leukocytes,UA: NEGATIVE
Nitrite, UA: NEGATIVE
Protein,UA: NEGATIVE
RBC, UA: NEGATIVE
Specific Gravity, UA: 1.01 (ref 1.005–1.030)
Urobilinogen, Ur: 0.2 mg/dL (ref 0.2–1.0)
pH, UA: 6.5 (ref 5.0–7.5)

## 2021-04-09 NOTE — Telephone Encounter (Signed)
Returned patient call- did not answer. Left message okay to come leave urine specimen. Orders placed. Appt created

## 2021-04-09 NOTE — Telephone Encounter (Signed)
Patient reports burning with urination at times since Friday. UA dip today completely negative.  Encouraged patient to increase water intake. Patient voiced understanding.

## 2021-04-09 NOTE — Telephone Encounter (Signed)
Patient left a Voice Mail:  UTI Symptoms. Patient wants to know if she can drop off a urine.  Please advise.  Call back:  [phone]  Thanks, Helene Kelp

## 2021-04-11 LAB — URINE CULTURE

## 2021-04-11 NOTE — Progress Notes (Signed)
normal

## 2021-04-11 NOTE — Progress Notes (Signed)
Results sent via my chart 

## 2021-04-12 ENCOUNTER — Other Ambulatory Visit: Payer: Self-pay

## 2021-04-12 ENCOUNTER — Ambulatory Visit
Admission: RE | Admit: 2021-04-12 | Discharge: 2021-04-12 | Disposition: A | Discharge status: Home / Self Care | Payer: Medicare PPO | Source: Ambulatory Visit | Attending: Family Medicine | Admitting: Family Medicine

## 2021-04-12 DIAGNOSIS — Z1231 Encounter for screening mammogram for malignant neoplasm of breast: Secondary | ICD-10-CM

## 2021-05-16 DIAGNOSIS — Z23 Encounter for immunization: Secondary | ICD-10-CM | POA: Diagnosis not present

## 2021-05-23 ENCOUNTER — Other Ambulatory Visit: Payer: Self-pay

## 2021-05-23 ENCOUNTER — Ambulatory Visit: Payer: Medicare PPO | Admitting: Urology

## 2021-05-23 ENCOUNTER — Encounter: Payer: Self-pay | Admitting: Urology

## 2021-05-23 VITALS — BP 162/82 | HR 92

## 2021-05-23 DIAGNOSIS — C672 Malignant neoplasm of lateral wall of bladder: Secondary | ICD-10-CM | POA: Diagnosis not present

## 2021-05-23 LAB — URINALYSIS, ROUTINE W REFLEX MICROSCOPIC
Bilirubin, UA: NEGATIVE
Glucose, UA: NEGATIVE
Ketones, UA: NEGATIVE
Leukocytes,UA: NEGATIVE
Nitrite, UA: NEGATIVE
Protein,UA: NEGATIVE
RBC, UA: NEGATIVE
Specific Gravity, UA: 1.025 (ref 1.005–1.030)
Urobilinogen, Ur: 0.2 mg/dL (ref 0.2–1.0)
pH, UA: 6 (ref 5.0–7.5)

## 2021-05-23 MED ORDER — CIPROFLOXACIN HCL 500 MG PO TABS
500.0000 mg | ORAL_TABLET | Freq: Once | ORAL | Status: AC
  Administered 2021-05-23: 500 mg via ORAL

## 2021-05-23 NOTE — Progress Notes (Signed)

## 2021-05-23 NOTE — Progress Notes (Signed)
   05/23/21  CC: followup bladder cancer  HPI: Ms Shifflett is a 70yo here for followup for high grade bladder cancer. She is scheduled to start 12 month maintenence BCG in Dec 2022.   Blood pressure (!) 162/82, pulse 92. NED. A&Ox3.   No respiratory distress   Abd soft, NT, ND Normal external genitalia with patent urethral meatus  Cystoscopy Procedure Note  Patient identification was confirmed, informed consent was obtained, and patient was prepped using Betadine solution.  Lidocaine jelly was administered per urethral meatus.    Procedure: - Flexible cystoscope introduced, without any difficulty.   - Thorough search of the bladder revealed:    normal urethral meatus    normal urothelium    no stones    no ulcers     no tumors    no urethral polyps    no trabeculation  - Ureteral orifices were normal in position and appearance.  Post-Procedure: - Patient tolerated the procedure well  Assessment/ Plan: Patient to start 3 week course BCG in 4 weeks. Followup 4 months for cystoscopy   No follow-ups on file.  [name], MD

## 2021-05-23 NOTE — Patient Instructions (Signed)
Bladder Cancer Bladder cancer is a condition in which abnormal tissue (a tumor) grows in the bladder. The bladder is the organ that holds urine. Two tubes (ureters) carry the urine from the kidneys to the bladder. The bladder wall is made of layers of tissue. Cancer that spreads through these layers of the bladder wall becomes more difficult to treat. What are the causes? The cause of this condition is not known. What increases the risk? The following factors may make you more likely to develop this condition: Smoking. Working where there are risks (occupational exposures), such as working with rubber, leather, clothing fabric, dyes, chemicals, and paint. Being 55 years of age or older. Being female. Having bladder inflammation that is long-term (chronic). Having a history of cancer, including: A family history of bladder cancer. Personal experience with bladder cancer. Having had certain treatments for cancer before. These include: Medicines to kill cancer cells (chemotherapy). Strong X-ray beams or capsules high in energy to kill cancer cells and shrink tumors (radiation therapy). Having been exposed to arsenic. This is a chemical element that can poison you. What are the signs or symptoms? Early symptoms of this condition include: Seeing blood in your urine. Feeling pain when urinating. Having infections of your urinary system (urinary tract infections or UTIs) that happen often. Having to urinate sooner or more often than usual. Later symptoms of this condition include: Not being able to urinate. Pain on one side of your lower back. Loss of appetite. Weight loss. Tiredness (fatigue). Swelling in your feet. Bone pain. How is this diagnosed? This condition is diagnosed based on: Your medical history. A physical exam. Lab tests, such as urine tests. Imaging tests. Your symptoms. You may also have other tests or procedures done, such as: A cystoscopy. A narrow tube is inserted  into your bladder through the organ that connects your bladder to the outside of your body (urethra). This is done to view the lining of your bladder for tumors. A biopsy. This procedure involves removing a tissue sample to look at it under a microscope to see if cancer is present. It is important to find out: How deeply into the bladder wall cancer has grown. Whether cancer has spread to any other parts of your body. This may require blood tests or imaging tests, such as a CT scan, MRI, bone scan, or X-rays. How is this treated? Your health care provider may recommend one or more types of treatment based on the stage of your cancer. The most common types of treatment are: Surgery to remove the cancer. Procedures that may be done include: Removing a tumor on the inside wall of the bladder (transurethral resection). Removing the bladder (cystectomy). Radiation therapy. This is often used together with chemotherapy. Chemotherapy. Immunotherapy. This uses medicines to help your immune system destroy cancer cells. Follow these instructions at home: Take over-the-counter and prescription medicines only as told by your health care provider. Eat a healthy diet. Some of your treatments might affect your appetite. Do not use any products that contain nicotine or tobacco, such as cigarettes, e-cigarettes, and chewing tobacco. If you need help quitting, ask your health care provider. Consider joining a support group. This may help you learn to cope with the stress of having bladder cancer. Tell your cancer care team if you develop side effects. Your team may be able to recommend ways to get relief. Keep all follow-up visits as told by your health care provider. This is important. Where to find more information American   Cancer Society: www.cancer.org National Cancer Institute (NCI): www.cancer.gov Contact a health care provider if: You have symptoms of a urinary tract infection. These  include: Fever. Chills. Weakness. Muscle aches. Pain in your abdomen. Urge to urinate that is stronger and happens more often than usual. Burning feeling in the bladder or urethra when you urinate. Get help right away if: There is blood in your urine. You cannot urinate. You have severe pain or other symptoms that do not go away. Summary Bladder cancer is a condition in which tumors grow in the bladder and cause illness. This condition is diagnosed based on your medical history, a physical exam, lab tests, imaging tests, and your symptoms. Your health care provider may recommend one or more types of treatment based on the stage of your cancer. Consider joining a support group. This may help you learn to cope with the stress of having bladder cancer. This information is not intended to replace advice given to you by your health care provider. Make sure you discuss any questions you have with your health care provider. Document Revised: 03/03/2019 Document Reviewed: 03/03/2019 Elsevier Patient Education  2022 [employer] Inc.  

## 2021-06-19 ENCOUNTER — Other Ambulatory Visit: Payer: Self-pay

## 2021-06-19 ENCOUNTER — Ambulatory Visit (INDEPENDENT_AMBULATORY_CARE_PROVIDER_SITE_OTHER): Payer: Medicare PPO

## 2021-06-19 DIAGNOSIS — C672 Malignant neoplasm of lateral wall of bladder: Secondary | ICD-10-CM | POA: Diagnosis not present

## 2021-06-19 LAB — URINALYSIS, ROUTINE W REFLEX MICROSCOPIC
Bilirubin, UA: NEGATIVE
Glucose, UA: NEGATIVE
Ketones, UA: NEGATIVE
Leukocytes,UA: NEGATIVE
Nitrite, UA: NEGATIVE
Protein,UA: NEGATIVE
RBC, UA: NEGATIVE
Specific Gravity, UA: 1.025 (ref 1.005–1.030)
Urobilinogen, Ur: 0.2 mg/dL (ref 0.2–1.0)
pH, UA: 5.5 (ref 5.0–7.5)

## 2021-06-19 MED ORDER — BCG LIVE 50 MG IS SUSR
3.2400 mL | Freq: Once | INTRAVESICAL | Status: AC
  Administered 2021-06-19: 81 mg via INTRAVESICAL

## 2021-06-19 NOTE — Progress Notes (Signed)
BCG Bladder Instillation  BCG # 1 of 3  Due to Bladder Cancer patient is present today for a BCG treatment. Patient was cleaned and prepped in a sterile fashion with betadine. A 14FR catheter was inserted, urine return was noted 6ml, urine was yellow in color.  20ml of reconstituted BCG was instilled into the bladder. The catheter was then removed. Patient tolerated well, no complications were noted  Performed by: Rowyn Spilde LPN  Follow up/ Additional notes: Keep next scheduled NV

## 2021-06-19 NOTE — Patient Instructions (Signed)

## 2021-06-24 NOTE — Progress Notes (Signed)
BCG Bladder Instillation  BCG # 2 of 3  Due to Bladder Cancer patient is present today for a BCG treatment. Patient was cleaned and prepped in a sterile fashion with betadine. A 14FR catheter was inserted, urine return was noted 72ml, urine was yellow in color.  103ml of reconstituted BCG was instilled into the bladder. The catheter was then removed. Patient tolerated well, no complications were noted  Performed by: Tamaria Dunleavy LPN  Follow up/ Additional notes: keep next scheduled NV

## 2021-06-26 ENCOUNTER — Other Ambulatory Visit: Payer: Self-pay

## 2021-06-26 ENCOUNTER — Ambulatory Visit (INDEPENDENT_AMBULATORY_CARE_PROVIDER_SITE_OTHER): Payer: Medicare PPO

## 2021-06-26 DIAGNOSIS — C672 Malignant neoplasm of lateral wall of bladder: Secondary | ICD-10-CM

## 2021-06-26 LAB — URINALYSIS, ROUTINE W REFLEX MICROSCOPIC
Bilirubin, UA: NEGATIVE
Glucose, UA: NEGATIVE
Ketones, UA: NEGATIVE
Nitrite, UA: NEGATIVE
Protein,UA: NEGATIVE
RBC, UA: NEGATIVE
Specific Gravity, UA: 1.025 (ref 1.005–1.030)
Urobilinogen, Ur: 0.2 mg/dL (ref 0.2–1.0)
pH, UA: 5.5 (ref 5.0–7.5)

## 2021-06-26 LAB — MICROSCOPIC EXAMINATION
Epithelial Cells (non renal): >10 /hpf — AB (ref 0–10)
RBC, Urine: NONE SEEN /hpf (ref 0–2)

## 2021-06-26 MED ORDER — BCG LIVE 50 MG IS SUSR
3.2400 mL | Freq: Once | INTRAVESICAL | Status: AC
  Administered 2021-06-26: 11:00:00 81 mg via INTRAVESICAL

## 2021-06-26 NOTE — Patient Instructions (Signed)

## 2021-07-03 ENCOUNTER — Other Ambulatory Visit: Payer: Self-pay

## 2021-07-03 ENCOUNTER — Ambulatory Visit (INDEPENDENT_AMBULATORY_CARE_PROVIDER_SITE_OTHER): Payer: Medicare PPO

## 2021-07-03 DIAGNOSIS — C672 Malignant neoplasm of lateral wall of bladder: Secondary | ICD-10-CM | POA: Diagnosis not present

## 2021-07-03 LAB — MICROSCOPIC EXAMINATION: Renal Epithel, UA: NONE SEEN /hpf

## 2021-07-03 LAB — URINALYSIS, ROUTINE W REFLEX MICROSCOPIC
Bilirubin, UA: NEGATIVE
Glucose, UA: NEGATIVE
Ketones, UA: NEGATIVE
Nitrite, UA: NEGATIVE
Protein,UA: NEGATIVE
Specific Gravity, UA: <1.03 (ref 1.005–1.030)
Urobilinogen, Ur: 0.2 mg/dL (ref 0.2–1.0)
pH, UA: 5 (ref 5.0–7.5)

## 2021-07-03 MED ORDER — BCG LIVE 50 MG IS SUSR
3.2400 mL | Freq: Once | INTRAVESICAL | Status: AC
  Administered 2021-07-03: 10:00:00 81 mg via INTRAVESICAL

## 2021-07-03 NOTE — Progress Notes (Signed)
BCG Bladder Instillation  BCG # 3  Due to Bladder Cancer patient is present today for a BCG treatment. Patient was cleaned and prepped in a sterile fashion with betadine. A 14FR catheter was inserted, urine return was noted 58ml, urine was yellow in color.  83ml of reconstituted BCG was instilled into the bladder. The catheter was then removed. Patient tolerated well, no complications were noted  Performed by: Estill Bamberg  Follow up/ Additional notes: to  f/u with MD

## 2021-09-07 ENCOUNTER — Encounter: Payer: Self-pay | Admitting: Urology

## 2021-09-07 ENCOUNTER — Ambulatory Visit (INDEPENDENT_AMBULATORY_CARE_PROVIDER_SITE_OTHER): Payer: Medicare PPO | Admitting: Urology

## 2021-09-07 ENCOUNTER — Other Ambulatory Visit: Payer: Self-pay

## 2021-09-07 VITALS — BP 141/81 | HR 103

## 2021-09-07 DIAGNOSIS — C672 Malignant neoplasm of lateral wall of bladder: Secondary | ICD-10-CM | POA: Diagnosis not present

## 2021-09-07 DIAGNOSIS — N3021 Other chronic cystitis with hematuria: Secondary | ICD-10-CM

## 2021-09-07 DIAGNOSIS — R3129 Other microscopic hematuria: Secondary | ICD-10-CM

## 2021-09-07 LAB — MICROSCOPIC EXAMINATION
RBC, Urine: NONE SEEN /hpf (ref 0–2)
Renal Epithel, UA: NONE SEEN /hpf

## 2021-09-07 LAB — URINALYSIS, ROUTINE W REFLEX MICROSCOPIC
Bilirubin, UA: NEGATIVE
Glucose, UA: NEGATIVE
Ketones, UA: NEGATIVE
Nitrite, UA: NEGATIVE
Protein,UA: NEGATIVE
RBC, UA: NEGATIVE
Specific Gravity, UA: 1.025 (ref 1.005–1.030)
Urobilinogen, Ur: 0.2 mg/dL (ref 0.2–1.0)
pH, UA: 5.5 (ref 5.0–7.5)

## 2021-09-07 MED ORDER — CIPROFLOXACIN HCL 500 MG PO TABS
500.0000 mg | ORAL_TABLET | Freq: Once | ORAL | Status: AC
  Administered 2021-09-07: 500 mg via ORAL

## 2021-09-07 NOTE — Patient Instructions (Signed)
Bladder Cancer Bladder cancer is a condition in which abnormal tissue (a tumor) grows in the bladder. The bladder is the organ that holds urine. Two tubes (ureters) carry the urine from the kidneys to the bladder. The bladder wall is made of layers of tissue. Cancer that spreads through these layers of the bladder wall becomes more difficult to treat. What are the causes? The cause of this condition is not known. What increases the risk? The following factors may make you more likely to develop this condition: Smoking. Working where there are risks (occupational exposures), such as working with rubber, leather, clothing fabric, dyes, chemicals, and paint. Being 72 years of age or older. Being female. Having bladder inflammation that is long-term (chronic). Having a history of cancer, including: A family history of bladder cancer. Personal experience with bladder cancer. Having had certain treatments for cancer before. These include: Medicines to kill cancer cells (chemotherapy). Strong X-ray beams or capsules high in energy to kill cancer cells and shrink tumors (radiation therapy). Having been exposed to arsenic. This is a chemical element that can poison you. What are the signs or symptoms? Early symptoms of this condition include: Seeing blood in your urine. Feeling pain when urinating. Having infections of your urinary system (urinary tract infections or UTIs) that happen often. Having to urinate sooner or more often than usual. Later symptoms of this condition include: Not being able to urinate. Pain on one side of your lower back. Loss of appetite. Weight loss. Tiredness (fatigue). Swelling in your feet. Bone pain. How is this diagnosed? This condition is diagnosed based on: Your medical history. A physical exam. Lab tests, such as urine tests. Imaging tests. Your symptoms. You may also have other tests or procedures done, such as: A cystoscopy. A narrow tube is inserted  into your bladder through the organ that connects your bladder to the outside of your body (urethra). This is done to view the lining of your bladder for tumors. A biopsy. This procedure involves removing a tissue sample to look at it under a microscope to see if cancer is present. It is important to find out: How deeply into the bladder wall cancer has grown. Whether cancer has spread to any other parts of your body. This may require blood tests or imaging tests, such as a CT scan, MRI, bone scan, or X-rays. How is this treated? Your health care provider may recommend one or more types of treatment based on the stage of your cancer. The most common types of treatment are: Surgery to remove the cancer. Procedures that may be done include: Removing a tumor on the inside wall of the bladder (transurethral resection). Removing the bladder (cystectomy). Radiation therapy. This is often used together with chemotherapy. Chemotherapy. Immunotherapy. This uses medicines to help your immune system destroy cancer cells. Follow these instructions at home: Take over-the-counter and prescription medicines only as told by your health care provider. Eat a healthy diet. Some of your treatments might affect your appetite. Do not use any products that contain nicotine or tobacco, such as cigarettes, e-cigarettes, and chewing tobacco. If you need help quitting, ask your health care provider. Consider joining a support group. This may help you learn to cope with the stress of having bladder cancer. Tell your cancer care team if you develop side effects. Your team may be able to recommend ways to get relief. Keep all follow-up visits as told by your health care provider. This is important. Where to find more information American   Cancer Society: www.cancer.org National Cancer Institute (NCI): www.cancer.gov Contact a health care provider if: You have symptoms of a urinary tract infection. These  include: Fever. Chills. Weakness. Muscle aches. Pain in your abdomen. Urge to urinate that is stronger and happens more often than usual. Burning feeling in the bladder or urethra when you urinate. Get help right away if: There is blood in your urine. You cannot urinate. You have severe pain or other symptoms that do not go away. Summary Bladder cancer is a condition in which tumors grow in the bladder and cause illness. This condition is diagnosed based on your medical history, a physical exam, lab tests, imaging tests, and your symptoms. Your health care provider may recommend one or more types of treatment based on the stage of your cancer. Consider joining a support group. This may help you learn to cope with the stress of having bladder cancer. This information is not intended to replace advice given to you by your health care provider. Make sure you discuss any questions you have with your health care provider. Document Revised: 03/03/2019 Document Reviewed: 03/03/2019 Elsevier Patient Education  2022 Elsevier Inc.  

## 2021-09-07 NOTE — Progress Notes (Signed)
? ?  09/07/21 ? ?CC: followup bladder cancer ? ? ?HPI: ?Peggy Hines is a 71yo here for followup for high grade bladder cancer. She finished 12 month maintenance BCG in 06/2021 ?Blood pressure (!) 141/81, pulse (!) 103. ?NED. A&Ox3.   ?No respiratory distress   ?Abd soft, NT, ND ?Normal external genitalia with patent urethral meatus ? ?Cystoscopy Procedure Note ? ?Patient identification was confirmed, informed consent was obtained, and patient was prepped using Betadine solution.  Lidocaine jelly was administered per urethral meatus.   ? ?Procedure: ?- Flexible cystoscope introduced, without any difficulty.   ?- Thorough search of the bladder revealed: ?   normal urethral meatus ?   normal urothelium ?   no stones ?   no ulcers  ?   no tumors ?   no urethral polyps ?   no trabeculation ? ?- Ureteral orifices were normal in position and appearance. ? ?Post-Procedure: ?- Patient tolerated the procedure well ? ?Assessment/ Plan: ? ? ? Return in about 3 months (around 12/08/2021) for cystoscopy. ? ?[name], MD  ?

## 2021-11-05 DIAGNOSIS — Z6831 Body mass index (BMI) 31.0-31.9, adult: Secondary | ICD-10-CM | POA: Diagnosis not present

## 2021-11-05 DIAGNOSIS — R221 Localized swelling, mass and lump, neck: Secondary | ICD-10-CM | POA: Diagnosis not present

## 2021-11-05 DIAGNOSIS — E6609 Other obesity due to excess calories: Secondary | ICD-10-CM | POA: Diagnosis not present

## 2021-11-06 ENCOUNTER — Other Ambulatory Visit: Payer: Self-pay | Admitting: Family Medicine

## 2021-11-06 ENCOUNTER — Other Ambulatory Visit (HOSPITAL_COMMUNITY): Payer: Self-pay | Admitting: Family Medicine

## 2021-11-06 DIAGNOSIS — R221 Localized swelling, mass and lump, neck: Secondary | ICD-10-CM

## 2021-11-15 ENCOUNTER — Ambulatory Visit (HOSPITAL_COMMUNITY)
Admission: RE | Admit: 2021-11-15 | Discharge: 2021-11-15 | Disposition: A | Discharge status: Home / Self Care | Payer: Medicare PPO | Source: Ambulatory Visit | Attending: Family Medicine | Admitting: Family Medicine

## 2021-11-15 DIAGNOSIS — R221 Localized swelling, mass and lump, neck: Secondary | ICD-10-CM | POA: Diagnosis not present

## 2021-11-20 DIAGNOSIS — R221 Localized swelling, mass and lump, neck: Secondary | ICD-10-CM | POA: Diagnosis not present

## 2021-11-21 ENCOUNTER — Other Ambulatory Visit (HOSPITAL_COMMUNITY): Payer: Self-pay | Admitting: Family Medicine

## 2021-11-21 ENCOUNTER — Other Ambulatory Visit: Payer: Self-pay | Admitting: Family Medicine

## 2021-11-26 ENCOUNTER — Other Ambulatory Visit (HOSPITAL_COMMUNITY): Payer: Self-pay | Admitting: Otolaryngology

## 2021-11-26 ENCOUNTER — Other Ambulatory Visit: Payer: Self-pay | Admitting: Otolaryngology

## 2021-11-26 DIAGNOSIS — R221 Localized swelling, mass and lump, neck: Secondary | ICD-10-CM

## 2021-11-30 ENCOUNTER — Ambulatory Visit (HOSPITAL_BASED_OUTPATIENT_CLINIC_OR_DEPARTMENT_OTHER)
Admission: RE | Admit: 2021-11-30 | Discharge: 2021-11-30 | Disposition: A | Discharge status: Home / Self Care | Payer: Medicare PPO | Source: Ambulatory Visit | Attending: Otolaryngology | Admitting: Otolaryngology

## 2021-11-30 DIAGNOSIS — R221 Localized swelling, mass and lump, neck: Secondary | ICD-10-CM | POA: Diagnosis not present

## 2021-11-30 LAB — POCT I-STAT CREATININE: Creatinine, Ser: 1 mg/dL (ref 0.44–1.00)

## 2021-11-30 MED ORDER — IOHEXOL 300 MG/ML  SOLN
100.0000 mL | Freq: Once | INTRAMUSCULAR | Status: AC | PRN
  Administered 2021-11-30: 75 mL via INTRAVENOUS

## 2021-12-12 ENCOUNTER — Ambulatory Visit (INDEPENDENT_AMBULATORY_CARE_PROVIDER_SITE_OTHER): Payer: Medicare PPO | Admitting: Urology

## 2021-12-12 ENCOUNTER — Encounter: Payer: Self-pay | Admitting: Urology

## 2021-12-12 ENCOUNTER — Other Ambulatory Visit: Payer: Medicare PPO | Admitting: Urology

## 2021-12-12 VITALS — BP 152/92 | HR 99 | Ht 68.0 in | Wt 200.0 lb

## 2021-12-12 DIAGNOSIS — C672 Malignant neoplasm of lateral wall of bladder: Secondary | ICD-10-CM

## 2021-12-12 LAB — URINALYSIS, ROUTINE W REFLEX MICROSCOPIC
Bilirubin, UA: NEGATIVE
Glucose, UA: NEGATIVE
Nitrite, UA: NEGATIVE
Protein,UA: NEGATIVE
RBC, UA: NEGATIVE
Specific Gravity, UA: 1.025 (ref 1.005–1.030)
Urobilinogen, Ur: 0.2 mg/dL (ref 0.2–1.0)
pH, UA: 5 (ref 5.0–7.5)

## 2021-12-12 LAB — MICROSCOPIC EXAMINATION
RBC, Urine: NONE SEEN /hpf (ref 0–2)
Renal Epithel, UA: NONE SEEN /hpf

## 2021-12-12 MED ORDER — CIPROFLOXACIN HCL 500 MG PO TABS
500.0000 mg | ORAL_TABLET | Freq: Once | ORAL | Status: AC
  Administered 2021-12-12: 500 mg via ORAL

## 2021-12-12 NOTE — Progress Notes (Signed)
   12/12/21  CC: followup bladder cancer   HPI: Peggy Hines is a 71yo here for followup for high grade bladder cancer. She is due for maintenance BCG in the next 3-4 weeks.  Blood pressure (!) 152/92, pulse 99, height '5\' 8"'$  (1.727 m), weight 200 lb (90.7 kg). NED. A&Ox3.   No respiratory distress   Abd soft, NT, ND Normal external genitalia with patent urethral meatus  Cystoscopy Procedure Note  Patient identification was confirmed, informed consent was obtained, and patient was prepped using Betadine solution.  Lidocaine jelly was administered per urethral meatus.    Procedure: - Flexible cystoscope introduced, without any difficulty.   - Thorough search of the bladder revealed:    normal urethral meatus    normal urothelium    no stones    no ulcers     no tumors    no urethral polyps    no trabeculation  - Ureteral orifices were normal in position and appearance.  Post-Procedure: - Patient tolerated the procedure well  Assessment/ Plan: Maintence BCG in next 3-4 weeks and then followup in 3 months for cystoscopy   No follow-ups on file.  [name], MD

## 2021-12-12 NOTE — Patient Instructions (Signed)

## 2021-12-19 ENCOUNTER — Other Ambulatory Visit: Payer: Medicare PPO | Admitting: Urology

## 2021-12-31 ENCOUNTER — Other Ambulatory Visit (HOSPITAL_COMMUNITY): Payer: Medicare PPO

## 2021-12-31 ENCOUNTER — Encounter (HOSPITAL_COMMUNITY): Payer: Self-pay

## 2022-01-01 DIAGNOSIS — R221 Localized swelling, mass and lump, neck: Secondary | ICD-10-CM | POA: Diagnosis not present

## 2022-01-02 ENCOUNTER — Ambulatory Visit: Payer: Medicare PPO

## 2022-01-02 DIAGNOSIS — C672 Malignant neoplasm of lateral wall of bladder: Secondary | ICD-10-CM | POA: Diagnosis not present

## 2022-01-02 LAB — URINALYSIS, ROUTINE W REFLEX MICROSCOPIC
Bilirubin, UA: NEGATIVE
Glucose, UA: NEGATIVE
Ketones, UA: NEGATIVE
Leukocytes,UA: NEGATIVE
Nitrite, UA: NEGATIVE
Protein,UA: NEGATIVE
RBC, UA: NEGATIVE
Specific Gravity, UA: 1.015 (ref 1.005–1.030)
Urobilinogen, Ur: 1 mg/dL (ref 0.2–1.0)
pH, UA: 6 (ref 5.0–7.5)

## 2022-01-02 MED ORDER — BCG LIVE 50 MG IS SUSR
3.2400 mL | Freq: Once | INTRAVESICAL | Status: AC
  Administered 2022-01-02: 81 mg via INTRAVESICAL

## 2022-01-02 NOTE — Progress Notes (Signed)
BCG Bladder Instillation  BCG # 1 of 3  Due to Bladder Cancer patient is present today for a BCG treatment. Patient was cleaned and prepped in a sterile fashion with betadine. A 14FR catheter was inserted, urine return was noted 41m, urine was yellow in color.  540mof reconstituted BCG was instilled into the bladder. The catheter was then removed. Patient tolerated well, no complications were noted  Performed by: AmEstill BambergN  Follow up/ Additional notes: weekly BCG

## 2022-01-09 ENCOUNTER — Ambulatory Visit (INDEPENDENT_AMBULATORY_CARE_PROVIDER_SITE_OTHER): Payer: Medicare PPO | Admitting: Physician Assistant

## 2022-01-09 DIAGNOSIS — C672 Malignant neoplasm of lateral wall of bladder: Secondary | ICD-10-CM

## 2022-01-09 LAB — URINALYSIS, ROUTINE W REFLEX MICROSCOPIC
Bilirubin, UA: NEGATIVE
Glucose, UA: NEGATIVE
Ketones, UA: NEGATIVE
Leukocytes,UA: NEGATIVE
Nitrite, UA: NEGATIVE
Protein,UA: NEGATIVE
RBC, UA: NEGATIVE
Specific Gravity, UA: 1.025 (ref 1.005–1.030)
Urobilinogen, Ur: 0.2 mg/dL (ref 0.2–1.0)
pH, UA: 5 (ref 5.0–7.5)

## 2022-01-09 MED ORDER — BCG LIVE 50 MG IS SUSR
3.2400 mL | Freq: Once | INTRAVESICAL | Status: AC
  Administered 2022-01-09: 81 mg via INTRAVESICAL

## 2022-01-09 NOTE — Progress Notes (Unsigned)
BCG Bladder Instillation  BCG # 2 of 3  Due to Bladder Cancer patient is present today for a BCG treatment. Patient was cleaned and prepped in a sterile fashion with betadine. A 14FR catheter was inserted, urine return was noted 115m, urine was yellow in color.  546mof reconstituted BCG was instilled into the bladder. The catheter was then removed. Patient tolerated well, no complications were noted  Performed by: KoLevi AlandCMA  Follow up/ Additional notes: Follow up as scheduled.

## 2022-01-16 ENCOUNTER — Ambulatory Visit (INDEPENDENT_AMBULATORY_CARE_PROVIDER_SITE_OTHER): Payer: Medicare PPO | Admitting: Physician Assistant

## 2022-01-16 DIAGNOSIS — C672 Malignant neoplasm of lateral wall of bladder: Secondary | ICD-10-CM

## 2022-01-16 LAB — URINALYSIS, ROUTINE W REFLEX MICROSCOPIC
Bilirubin, UA: NEGATIVE
Glucose, UA: NEGATIVE
Ketones, UA: NEGATIVE
Leukocytes,UA: NEGATIVE
Nitrite, UA: NEGATIVE
Protein,UA: NEGATIVE
RBC, UA: NEGATIVE
Specific Gravity, UA: 1.025 (ref 1.005–1.030)
Urobilinogen, Ur: 0.2 mg/dL (ref 0.2–1.0)
pH, UA: 5 (ref 5.0–7.5)

## 2022-01-16 MED ORDER — BCG LIVE 50 MG IS SUSR
3.2400 mL | Freq: Once | INTRAVESICAL | Status: AC
  Administered 2022-01-16: 81 mg via INTRAVESICAL

## 2022-01-16 NOTE — Progress Notes (Signed)
BCG Bladder Instillation  BCG # 3   Due to Bladder Cancer patient is present today for a BCG treatment. Patient was cleaned and prepped in a sterile fashion with betadine. A 14FR catheter was inserted, urine return was noted 79m, urine was yellow in color.  573mof reconstituted BCG was instilled into the bladder. The catheter was then removed. Patient tolerated well, no complications were noted  Performed by: AmEstill BambergN  Follow up/ Additional notes: f/u with scheduled appt

## 2022-03-12 ENCOUNTER — Other Ambulatory Visit: Payer: Self-pay | Admitting: Family Medicine

## 2022-03-12 DIAGNOSIS — Z1231 Encounter for screening mammogram for malignant neoplasm of breast: Secondary | ICD-10-CM

## 2022-03-13 ENCOUNTER — Ambulatory Visit: Payer: Medicare PPO | Admitting: Urology

## 2022-03-13 VITALS — BP 146/78 | HR 99

## 2022-03-13 DIAGNOSIS — C672 Malignant neoplasm of lateral wall of bladder: Secondary | ICD-10-CM | POA: Diagnosis not present

## 2022-03-13 DIAGNOSIS — Z8551 Personal history of malignant neoplasm of bladder: Secondary | ICD-10-CM

## 2022-03-13 MED ORDER — CIPROFLOXACIN HCL 500 MG PO TABS
500.0000 mg | ORAL_TABLET | Freq: Once | ORAL | Status: AC
  Administered 2022-03-13: 500 mg via ORAL

## 2022-03-13 NOTE — Progress Notes (Signed)
   03/13/22  CC: followup bladder cancer  HPI: Peggy Hines is a 71yo here for followup for bladder cancer Blood pressure (!) 146/78, pulse 99. NED. A&Ox3.   No respiratory distress   Abd soft, NT, ND Normal external genitalia with patent urethral meatus  Cystoscopy Procedure Note  Patient identification was confirmed, informed consent was obtained, and patient was prepped using Betadine solution.  Lidocaine jelly was administered per urethral meatus.    Procedure: - Flexible cystoscope introduced, without any difficulty.   - Thorough search of the bladder revealed:    normal urethral meatus    normal urothelium    no stones    no ulcers     no tumors    no urethral polyps    no trabeculation  - Ureteral orifices were normal in position and appearance.  Post-Procedure: - Patient tolerated the procedure well  Assessment/ Plan: RTC 6 months for cystoscopy    No follow-ups on file.  [name], MD

## 2022-03-25 ENCOUNTER — Encounter: Payer: Self-pay | Admitting: Urology

## 2022-03-25 NOTE — Patient Instructions (Signed)

## 2022-03-26 DIAGNOSIS — R221 Localized swelling, mass and lump, neck: Secondary | ICD-10-CM | POA: Diagnosis not present

## 2022-03-26 LAB — URINALYSIS, ROUTINE W REFLEX MICROSCOPIC
Bilirubin, UA: NEGATIVE
Glucose, UA: NEGATIVE
Ketones, UA: NEGATIVE
Leukocytes,UA: NEGATIVE
Nitrite, UA: NEGATIVE
Protein,UA: NEGATIVE
RBC, UA: NEGATIVE
Specific Gravity, UA: 1.02 (ref 1.005–1.030)
Urobilinogen, Ur: 0.2 mg/dL (ref 0.2–1.0)
pH, UA: 5.5 (ref 5.0–7.5)

## 2022-04-24 ENCOUNTER — Ambulatory Visit
Admission: RE | Admit: 2022-04-24 | Discharge: 2022-04-24 | Disposition: A | Discharge status: Home / Self Care | Payer: Medicare PPO | Source: Ambulatory Visit | Attending: Family Medicine | Admitting: Family Medicine

## 2022-04-24 DIAGNOSIS — Z1231 Encounter for screening mammogram for malignant neoplasm of breast: Secondary | ICD-10-CM

## 2022-05-20 DIAGNOSIS — Z23 Encounter for immunization: Secondary | ICD-10-CM | POA: Diagnosis not present

## 2022-06-20 DIAGNOSIS — E782 Mixed hyperlipidemia: Secondary | ICD-10-CM | POA: Diagnosis not present

## 2022-06-20 DIAGNOSIS — R221 Localized swelling, mass and lump, neck: Secondary | ICD-10-CM | POA: Diagnosis not present

## 2022-06-20 DIAGNOSIS — C679 Malignant neoplasm of bladder, unspecified: Secondary | ICD-10-CM | POA: Diagnosis not present

## 2022-06-20 DIAGNOSIS — Z1331 Encounter for screening for depression: Secondary | ICD-10-CM | POA: Diagnosis not present

## 2022-06-20 DIAGNOSIS — E119 Type 2 diabetes mellitus without complications: Secondary | ICD-10-CM | POA: Diagnosis not present

## 2022-06-20 DIAGNOSIS — Z0001 Encounter for general adult medical examination with abnormal findings: Secondary | ICD-10-CM | POA: Diagnosis not present

## 2022-06-20 DIAGNOSIS — Z1231 Encounter for screening mammogram for malignant neoplasm of breast: Secondary | ICD-10-CM | POA: Diagnosis not present

## 2022-06-20 DIAGNOSIS — L121 Cicatricial pemphigoid: Secondary | ICD-10-CM | POA: Diagnosis not present

## 2022-06-20 DIAGNOSIS — E6609 Other obesity due to excess calories: Secondary | ICD-10-CM | POA: Diagnosis not present

## 2022-06-20 DIAGNOSIS — E7849 Other hyperlipidemia: Secondary | ICD-10-CM | POA: Diagnosis not present

## 2022-06-25 DIAGNOSIS — K118 Other diseases of salivary glands: Secondary | ICD-10-CM | POA: Diagnosis not present

## 2022-07-15 DIAGNOSIS — D84821 Immunodeficiency due to drugs: Secondary | ICD-10-CM | POA: Diagnosis not present

## 2022-07-15 DIAGNOSIS — L121 Cicatricial pemphigoid: Secondary | ICD-10-CM | POA: Diagnosis not present

## 2022-07-15 DIAGNOSIS — Z79899 Other long term (current) drug therapy: Secondary | ICD-10-CM | POA: Diagnosis not present

## 2022-07-22 DIAGNOSIS — L820 Inflamed seborrheic keratosis: Secondary | ICD-10-CM | POA: Diagnosis not present

## 2022-08-30 DIAGNOSIS — Z79620 Long term (current) use of immunosuppressive biologic: Secondary | ICD-10-CM | POA: Diagnosis not present

## 2022-09-04 DIAGNOSIS — L121 Cicatricial pemphigoid: Secondary | ICD-10-CM | POA: Diagnosis not present

## 2022-09-11 ENCOUNTER — Ambulatory Visit: Payer: Medicare PPO | Admitting: Urology

## 2022-09-11 VITALS — BP 148/82 | HR 90

## 2022-09-11 DIAGNOSIS — C672 Malignant neoplasm of lateral wall of bladder: Secondary | ICD-10-CM | POA: Diagnosis not present

## 2022-09-11 DIAGNOSIS — C679 Malignant neoplasm of bladder, unspecified: Secondary | ICD-10-CM | POA: Diagnosis not present

## 2022-09-11 DIAGNOSIS — Z8551 Personal history of malignant neoplasm of bladder: Secondary | ICD-10-CM | POA: Diagnosis not present

## 2022-09-11 LAB — URINALYSIS, ROUTINE W REFLEX MICROSCOPIC
Bilirubin, UA: NEGATIVE
Glucose, UA: NEGATIVE
Ketones, UA: NEGATIVE
Nitrite, UA: NEGATIVE
Protein,UA: NEGATIVE
RBC, UA: NEGATIVE
Specific Gravity, UA: 1.025 (ref 1.005–1.030)
Urobilinogen, Ur: 0.2 mg/dL (ref 0.2–1.0)
pH, UA: 5.5 (ref 5.0–7.5)

## 2022-09-11 LAB — MICROSCOPIC EXAMINATION: Epithelial Cells (non renal): >10 /hpf — AB (ref 0–10)

## 2022-09-11 MED ORDER — CIPROFLOXACIN HCL 500 MG PO TABS
500.0000 mg | ORAL_TABLET | Freq: Once | ORAL | Status: AC
  Administered 2022-09-11: 500 mg via ORAL

## 2022-09-11 NOTE — Progress Notes (Unsigned)
   09/11/22  CC: followup bladder cancer   HPI: [name] is a 72yo here for followup for bladder cancer Blood pressure (!) 148/82, pulse 90. NED. A&Ox3.   No respiratory distress   Abd soft, NT, ND Normal external genitalia with patent urethral meatus  Cystoscopy Procedure Note  Patient identification was confirmed, informed consent was obtained, and patient was prepped using Betadine solution.  Lidocaine jelly was administered per urethral meatus.    Procedure: - Flexible cystoscope introduced, without any difficulty.   - Thorough search of the bladder revealed:    normal urethral meatus    normal urothelium    no stones    no ulcers     no tumors    no urethral polyps    no trabeculation  - Ureteral orifices were normal in position and appearance.  Post-Procedure: - Patient tolerated the procedure well  Assessment/ Plan: Followup 6 months for cystoscopy   No follow-ups on file.  [name], MD

## 2022-09-12 ENCOUNTER — Encounter: Payer: Self-pay | Admitting: Urology

## 2022-09-12 DIAGNOSIS — Z79620 Long term (current) use of immunosuppressive biologic: Secondary | ICD-10-CM | POA: Diagnosis not present

## 2022-09-12 NOTE — Patient Instructions (Signed)

## 2022-09-17 DIAGNOSIS — L121 Cicatricial pemphigoid: Secondary | ICD-10-CM | POA: Diagnosis not present

## 2022-10-22 DIAGNOSIS — K118 Other diseases of salivary glands: Secondary | ICD-10-CM | POA: Diagnosis not present

## 2023-03-12 ENCOUNTER — Ambulatory Visit: Payer: Medicare PPO | Admitting: Urology

## 2023-03-12 VITALS — BP 167/87 | HR 103

## 2023-03-12 DIAGNOSIS — N3021 Other chronic cystitis with hematuria: Secondary | ICD-10-CM

## 2023-03-12 DIAGNOSIS — C672 Malignant neoplasm of lateral wall of bladder: Secondary | ICD-10-CM

## 2023-03-12 DIAGNOSIS — Z8551 Personal history of malignant neoplasm of bladder: Secondary | ICD-10-CM | POA: Diagnosis not present

## 2023-03-12 LAB — URINALYSIS, ROUTINE W REFLEX MICROSCOPIC
Bilirubin, UA: NEGATIVE
Glucose, UA: NEGATIVE
Ketones, UA: NEGATIVE
Leukocytes,UA: NEGATIVE
Nitrite, UA: NEGATIVE
Protein,UA: NEGATIVE
RBC, UA: NEGATIVE
Specific Gravity, UA: 1.025 (ref 1.005–1.030)
Urobilinogen, Ur: 0.2 mg/dL (ref 0.2–1.0)
pH, UA: 6 (ref 5.0–7.5)

## 2023-03-12 MED ORDER — CIPROFLOXACIN HCL 500 MG PO TABS
500.0000 mg | ORAL_TABLET | Freq: Once | ORAL | Status: AC
  Administered 2023-03-12: 500 mg via ORAL

## 2023-03-12 NOTE — Progress Notes (Signed)
   03/12/23  CC: followup bladder cancer   HPI:  Blood pressure (!) 167/87, pulse (!) 103. NED. A&Ox3.   No respiratory distress   Abd soft, NT, ND Normal external genitalia with patent urethral meatus  Cystoscopy Procedure Note  Patient identification was confirmed, informed consent was obtained, and patient was prepped using Betadine solution.  Lidocaine jelly was administered per urethral meatus.    Procedure: - Flexible cystoscope introduced, without any difficulty.   - Thorough search of the bladder revealed:    normal urethral meatus    normal urothelium    no stones    no ulcers     no tumors    no urethral polyps    no trabeculation  - Ureteral orifices were normal in position and appearance.  Post-Procedure: - Patient tolerated the procedure well  Assessment/ Plan: Followup 6 months for cystoscopy     No follow-ups on file.  Wilkie Aye, MD

## 2023-03-12 NOTE — Patient Instructions (Signed)

## 2023-03-18 ENCOUNTER — Encounter: Payer: Self-pay | Admitting: Urology

## 2023-04-02 ENCOUNTER — Encounter: Payer: Self-pay | Admitting: Family Medicine

## 2023-04-02 ENCOUNTER — Other Ambulatory Visit: Payer: Self-pay | Admitting: Family Medicine

## 2023-04-02 DIAGNOSIS — L121 Cicatricial pemphigoid: Secondary | ICD-10-CM | POA: Diagnosis not present

## 2023-04-02 DIAGNOSIS — Z0000 Encounter for general adult medical examination without abnormal findings: Secondary | ICD-10-CM

## 2023-04-03 DIAGNOSIS — Z79899 Other long term (current) drug therapy: Secondary | ICD-10-CM | POA: Diagnosis not present

## 2023-04-03 DIAGNOSIS — L121 Cicatricial pemphigoid: Secondary | ICD-10-CM | POA: Diagnosis not present

## 2023-04-16 DIAGNOSIS — L121 Cicatricial pemphigoid: Secondary | ICD-10-CM | POA: Diagnosis not present

## 2023-04-17 DIAGNOSIS — L121 Cicatricial pemphigoid: Secondary | ICD-10-CM | POA: Diagnosis not present

## 2023-04-28 DIAGNOSIS — L121 Cicatricial pemphigoid: Secondary | ICD-10-CM | POA: Diagnosis not present

## 2023-04-28 DIAGNOSIS — Z79620 Long term (current) use of immunosuppressive biologic: Secondary | ICD-10-CM | POA: Diagnosis not present

## 2023-04-29 ENCOUNTER — Ambulatory Visit
Admission: RE | Admit: 2023-04-29 | Discharge: 2023-04-29 | Disposition: A | Discharge status: Home / Self Care | Payer: Medicare PPO | Source: Ambulatory Visit | Attending: Family Medicine | Admitting: Family Medicine

## 2023-04-29 DIAGNOSIS — Z0000 Encounter for general adult medical examination without abnormal findings: Secondary | ICD-10-CM

## 2023-04-29 DIAGNOSIS — Z1231 Encounter for screening mammogram for malignant neoplasm of breast: Secondary | ICD-10-CM | POA: Diagnosis not present

## 2023-05-16 DIAGNOSIS — K118 Other diseases of salivary glands: Secondary | ICD-10-CM | POA: Diagnosis not present

## 2023-06-09 DIAGNOSIS — L820 Inflamed seborrheic keratosis: Secondary | ICD-10-CM | POA: Diagnosis not present

## 2023-06-09 DIAGNOSIS — L918 Other hypertrophic disorders of the skin: Secondary | ICD-10-CM | POA: Diagnosis not present

## 2023-06-09 DIAGNOSIS — B078 Other viral warts: Secondary | ICD-10-CM | POA: Diagnosis not present

## 2023-06-25 DIAGNOSIS — L121 Cicatricial pemphigoid: Secondary | ICD-10-CM | POA: Diagnosis not present

## 2023-06-25 DIAGNOSIS — E6609 Other obesity due to excess calories: Secondary | ICD-10-CM | POA: Diagnosis not present

## 2023-06-25 DIAGNOSIS — E1159 Type 2 diabetes mellitus with other circulatory complications: Secondary | ICD-10-CM | POA: Diagnosis not present

## 2023-06-25 DIAGNOSIS — E7849 Other hyperlipidemia: Secondary | ICD-10-CM | POA: Diagnosis not present

## 2023-06-25 DIAGNOSIS — Z0001 Encounter for general adult medical examination with abnormal findings: Secondary | ICD-10-CM | POA: Diagnosis not present

## 2023-06-25 DIAGNOSIS — E119 Type 2 diabetes mellitus without complications: Secondary | ICD-10-CM | POA: Diagnosis not present

## 2023-06-25 DIAGNOSIS — Z1331 Encounter for screening for depression: Secondary | ICD-10-CM | POA: Diagnosis not present

## 2023-06-25 DIAGNOSIS — C679 Malignant neoplasm of bladder, unspecified: Secondary | ICD-10-CM | POA: Diagnosis not present

## 2023-06-25 DIAGNOSIS — E782 Mixed hyperlipidemia: Secondary | ICD-10-CM | POA: Diagnosis not present

## 2023-06-25 DIAGNOSIS — Z6830 Body mass index (BMI) 30.0-30.9, adult: Secondary | ICD-10-CM | POA: Diagnosis not present

## 2023-06-26 ENCOUNTER — Encounter (INDEPENDENT_AMBULATORY_CARE_PROVIDER_SITE_OTHER): Payer: Self-pay | Admitting: *Deleted

## 2023-08-07 ENCOUNTER — Telehealth: Payer: Self-pay | Admitting: *Deleted

## 2023-08-07 NOTE — Telephone Encounter (Signed)
  Procedure: COLONOSCOPY  Height: 5'8 Weight: 200LBS        Have you had a colonoscopy before?  YES BUT NOT SURE HOW LONG AGO AND SOMEWHERE IN [place]  Do you have family history of colon cancer?  no  Do you have a family history of polyps? no  Previous colonoscopy with polyps removed? no  Do you have a history colorectal cancer?   no  Are you diabetic?  no  Do you have a prosthetic or mechanical heart valve? no  Do you have a pacemaker/defibrillator?   no  Have you had endocarditis/atrial fibrillation?  no  Do you use supplemental oxygen/CPAP?  no  Have you had joint replacement within the last 12 months?  no  Do you tend to be constipated or have to use laxatives?  no   Do you have history of alcohol use? If yes, how much and how often.  nono  Do you have history or are you using drugs? If yes, what do are you  using?  no  Have you ever had a stroke/heart attack?  no  Have you ever had a heart or other vascular stent placed,?no  Do you take weight loss medication? no  female patients,: have you had a hysterectomy? yes                              are you post menopausal?  no                              do you still have your menstrual cycle? no    Date of last menstrual period?   Do you take any blood-thinning medications such as: (Plavix, aspirin, Coumadin, Aggrenox, Brilinta, Xarelto, Eliquis, Pradaxa, Savaysa or Effient)? Aspirin 81mg   If yes we need the name, milligram, dosage and who is prescribing doctor:               Current Outpatient Medications  Medication Sig Dispense Refill   aspirin EC 81 MG tablet Take 81 mg by mouth every evening.      atorvastatin (LIPITOR) 20 MG tablet Take 20 mg by mouth every evening.      Cholecalciferol (VITAMIN D3) 50 MCG (2000 UT) TABS Take 2,000 Units by mouth every evening.     famotidine (PEPCID) 20 MG tablet Take 20 mg by mouth at bedtime.     Multiple Vitamins-Minerals (PRESERVISION AREDS 2 PO) Take 1 tablet by mouth at  bedtime.     Omega-3 Fatty Acids (FISH OIL) 1000 MG CAPS Take 2,000 mg by mouth daily.     riTUXimab (RITUXAN IV) Inject 1 Dose into the vein every 6 (six) months.     No current facility-administered medications for this visit.    Allergies  Allergen Reactions   Tamiflu  [Oseltamivir] Nausea And Vomiting

## 2023-08-14 NOTE — Telephone Encounter (Signed)
Ok to schedule. ASA 2.  

## 2023-08-15 ENCOUNTER — Encounter (INDEPENDENT_AMBULATORY_CARE_PROVIDER_SITE_OTHER): Payer: Self-pay | Admitting: *Deleted

## 2023-08-15 MED ORDER — NA SULFATE-K SULFATE-MG SULF 17.5-3.13-1.6 GM/177ML PO SOLN: 1.0000 | ORAL | 0 refills | Status: AC

## 2023-08-15 NOTE — Telephone Encounter (Signed)
 PA approved via [employer]here Authorization #[id number], DOS: 09/15/2023 - 11/15/2023

## 2023-08-15 NOTE — Telephone Encounter (Signed)
 Spoke with pt. Scheduled for 3/10 with Dr. Riley Cheadle. Aware will send instructions. Rx for prep sent to pharmacy

## 2023-08-15 NOTE — Telephone Encounter (Signed)
 Referral completed, [facility] apt letter sent to PCP

## 2023-08-15 NOTE — Addendum Note (Signed)
 Addended by: Feliz Hosteller on: 08/15/2023 09:56 AM   Modules accepted: Orders

## 2023-09-02 IMAGING — MG MM DIGITAL SCREENING BILAT W/ TOMO AND CAD
8 series · 8 of 24 positions shown · non-contrast
Comparison: Previous exam(s).

CLINICAL DATA: Screening.

EXAM:
DIGITAL SCREENING BILATERAL MAMMOGRAM WITH TOMOSYNTHESIS AND CAD
TECHNIQUE: Bilateral screening digital craniocaudal and mediolateral oblique
mammograms were obtained. Bilateral screening digital breast
tomosynthesis was performed. The images were evaluated with
computer-aided detection.

[R MLO synth-2D]
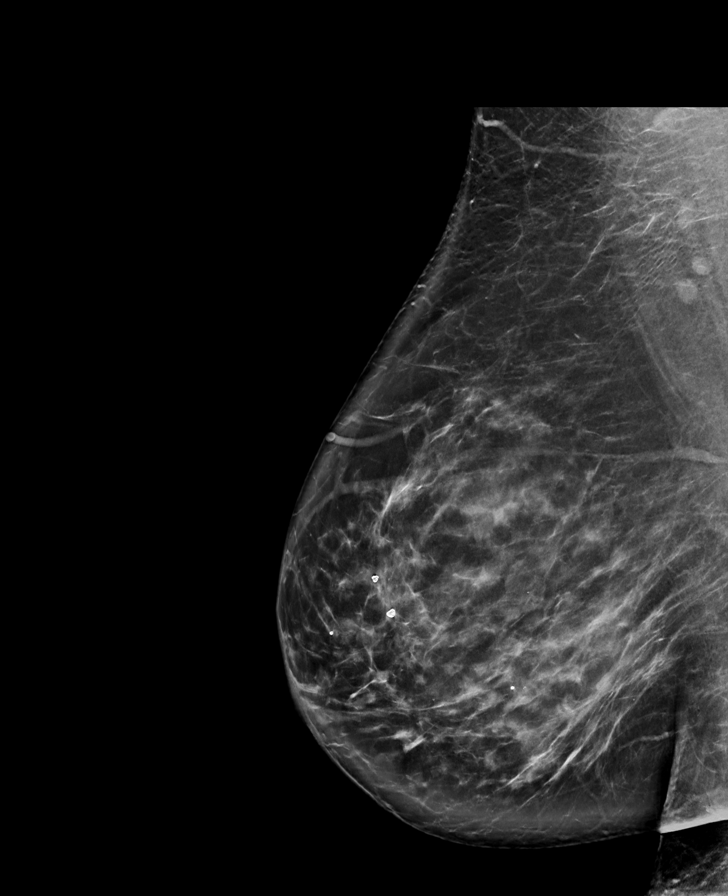

[L CC synth-2D]
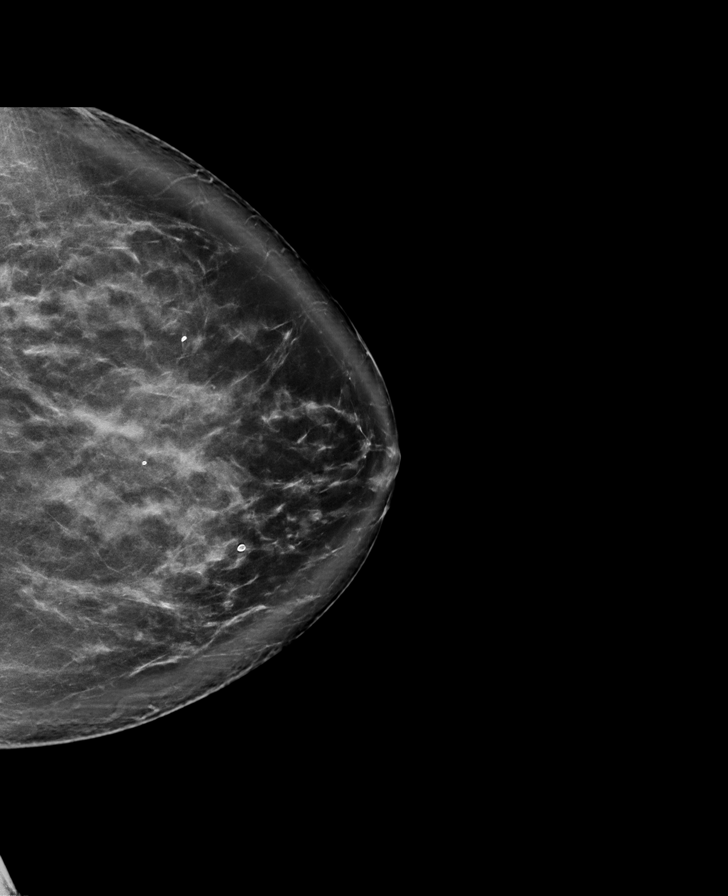

[L MLO synth-2D]
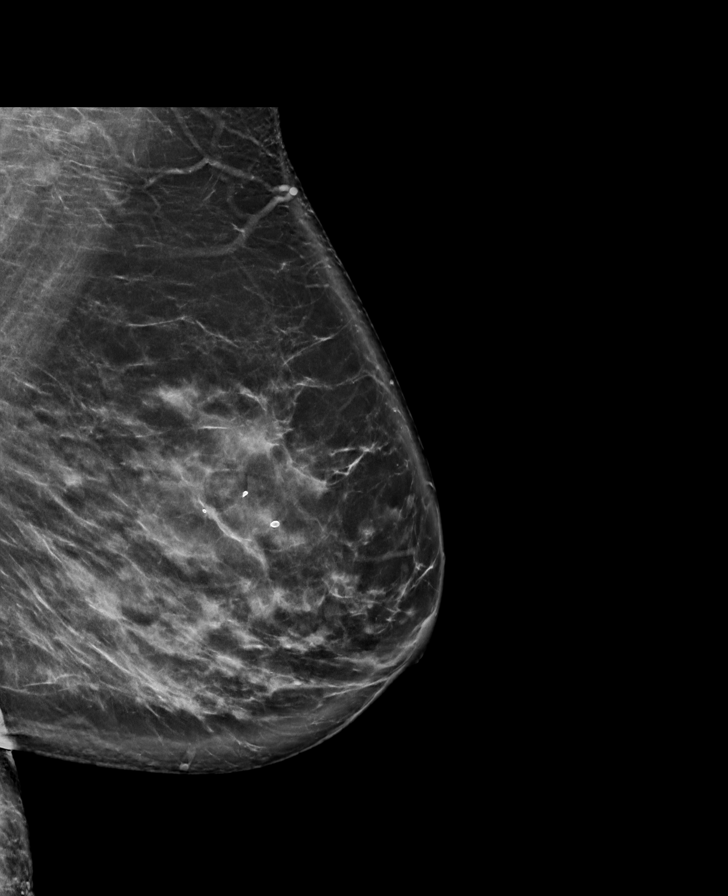

[R CC synth-2D]
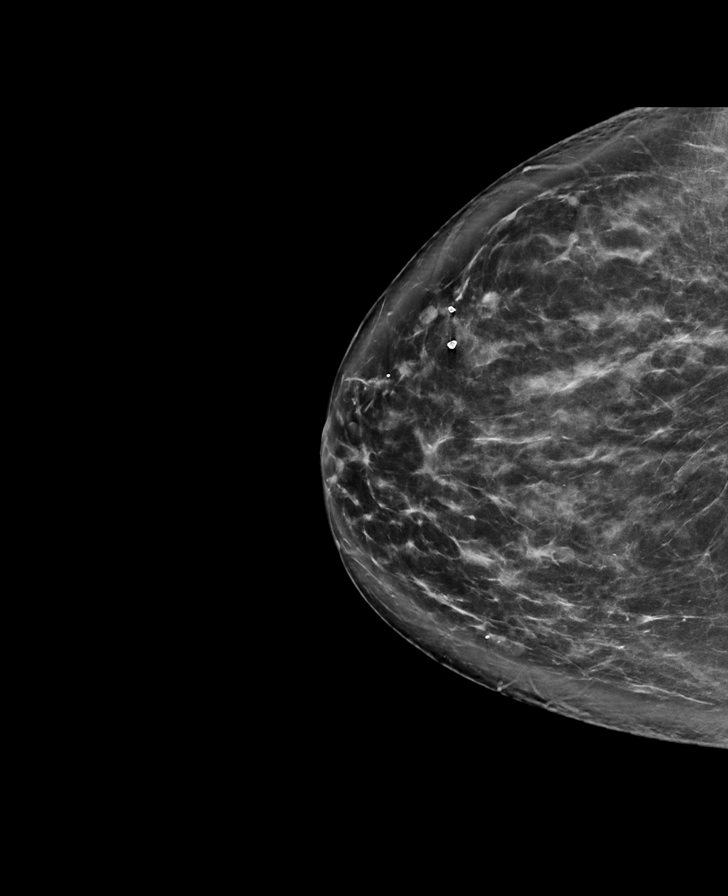

[R CC tomo · tomo slice 49/98.0]
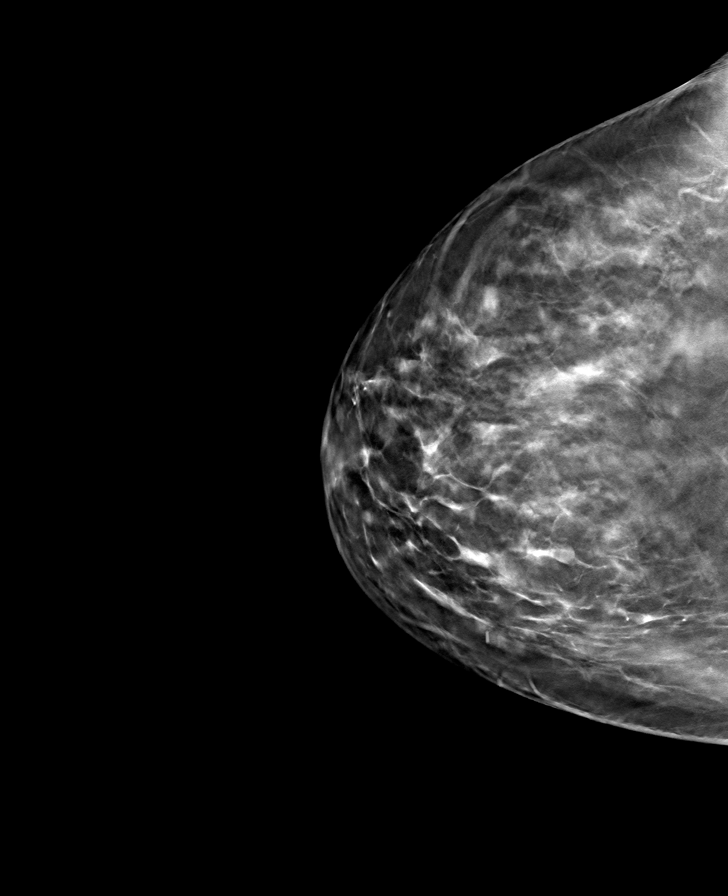

[L MLO tomo · tomo slice 53/104.0]
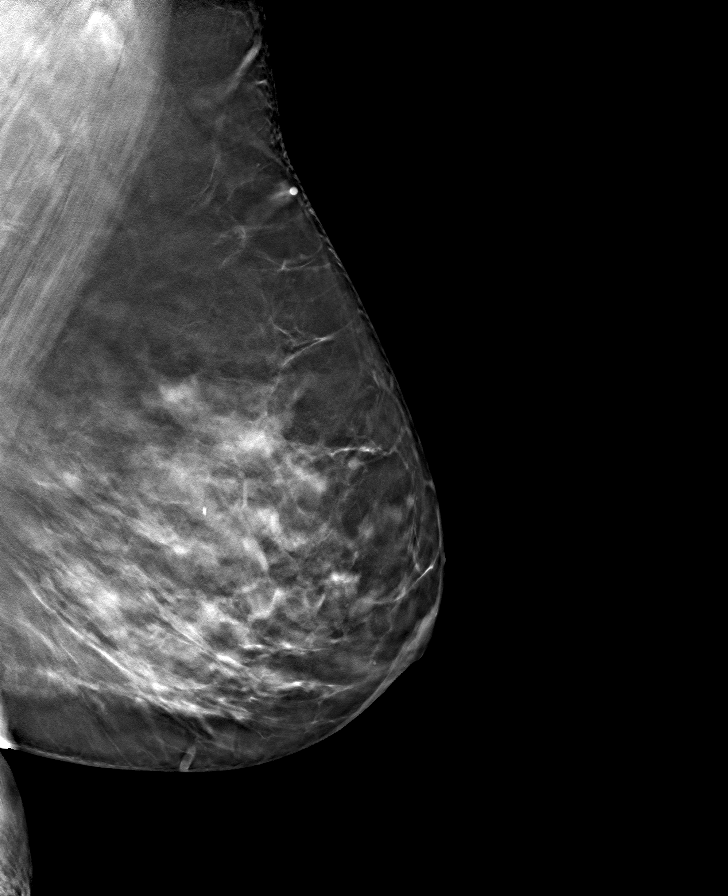

[L CC tomo · tomo slice 55/109.0]
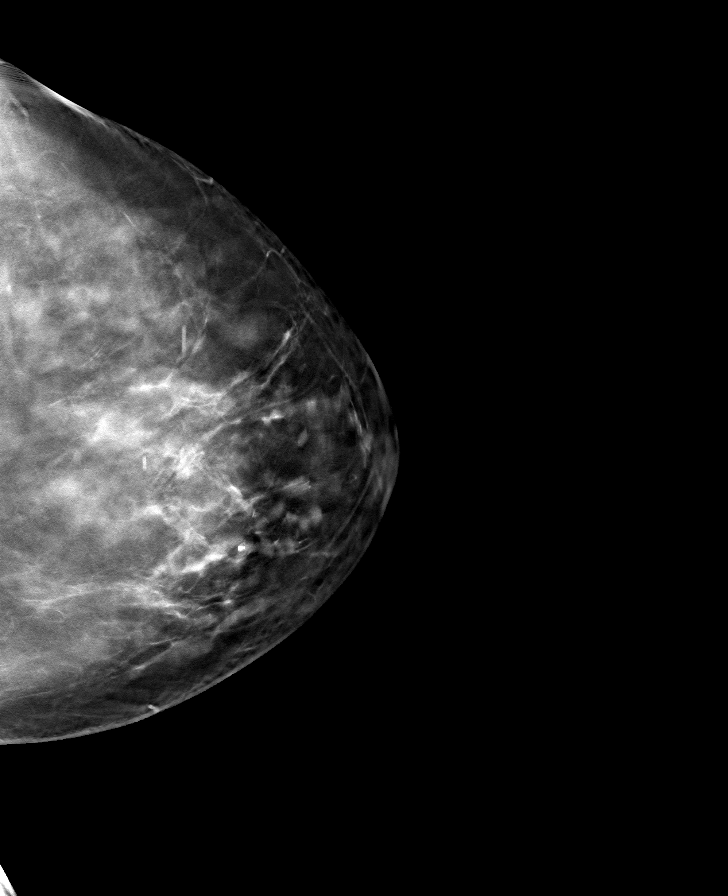

[R MLO tomo · tomo slice 51/102.0]
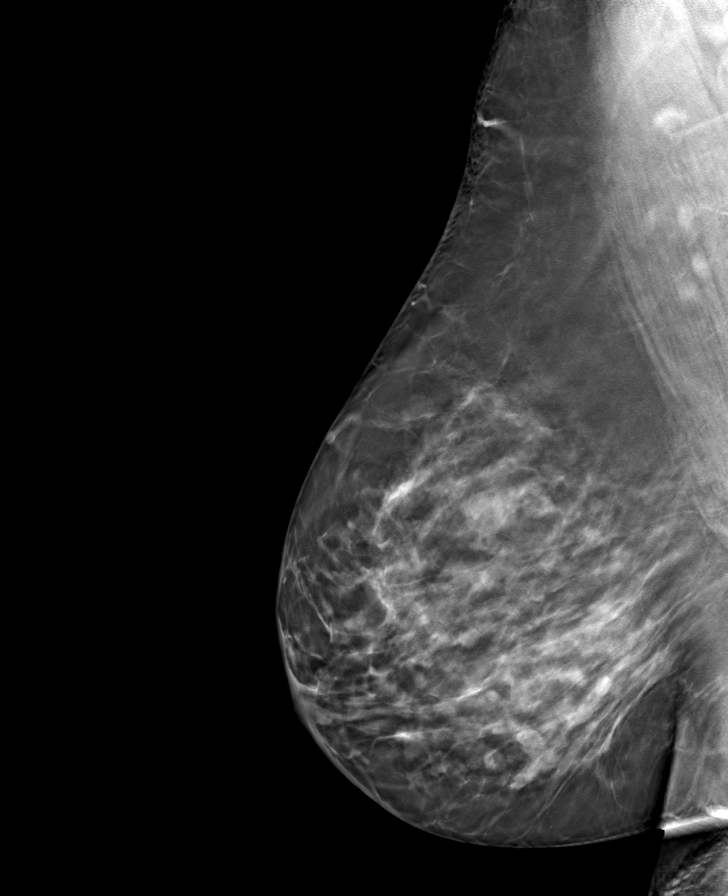

[8 of 24 positions shown; findings below may reference images not displayed]

ACR Breast Density Category c: The breast tissue is heterogeneously
dense, which may obscure small masses.
FINDINGS: There are no findings suspicious for malignancy.
IMPRESSION: No mammographic evidence of malignancy. A result letter of this
screening mammogram will be mailed directly to the patient.

RECOMMENDATION:
Screening mammogram in one year. (Code:Q3-W-BC3)

BI-RADS CATEGORY  1: Negative.

## 2023-09-10 ENCOUNTER — Ambulatory Visit: Payer: Medicare PPO | Admitting: Urology

## 2023-09-10 VITALS — BP 158/97 | HR 90

## 2023-09-10 DIAGNOSIS — Z8551 Personal history of malignant neoplasm of bladder: Secondary | ICD-10-CM | POA: Diagnosis not present

## 2023-09-10 DIAGNOSIS — C679 Malignant neoplasm of bladder, unspecified: Secondary | ICD-10-CM | POA: Diagnosis not present

## 2023-09-10 DIAGNOSIS — R82998 Other abnormal findings in urine: Secondary | ICD-10-CM | POA: Diagnosis not present

## 2023-09-10 DIAGNOSIS — C672 Malignant neoplasm of lateral wall of bladder: Secondary | ICD-10-CM

## 2023-09-10 LAB — MICROSCOPIC EXAMINATION

## 2023-09-10 LAB — URINALYSIS, ROUTINE W REFLEX MICROSCOPIC
Bilirubin, UA: NEGATIVE
Glucose, UA: NEGATIVE
Ketones, UA: NEGATIVE
Nitrite, UA: NEGATIVE
Protein,UA: NEGATIVE
RBC, UA: NEGATIVE
Specific Gravity, UA: >1.03 — ABNORMAL HIGH (ref 1.005–1.030)
Urobilinogen, Ur: 0.2 mg/dL (ref 0.2–1.0)
pH, UA: 6 (ref 5.0–7.5)

## 2023-09-10 MED ORDER — CIPROFLOXACIN HCL 500 MG PO TABS
500.0000 mg | ORAL_TABLET | Freq: Once | ORAL | Status: AC
  Administered 2023-09-10: 500 mg via ORAL

## 2023-09-10 NOTE — Progress Notes (Signed)
   09/10/23  CC: followup bladder cancer   HPI: Peggy Hines is a 72yo here for followup for bladder cancer There were no vitals taken for this visit. NED. A&Ox3.   No respiratory distress   Abd soft, NT, ND Normal external genitalia with patent urethral meatus  Cystoscopy Procedure Note  Patient identification was confirmed, informed consent was obtained, and patient was prepped using Betadine solution.  Lidocaine jelly was administered per urethral meatus.    Procedure: - Flexible cystoscope introduced, without any difficulty.   - Thorough search of the bladder revealed:    normal urethral meatus    normal urothelium    no stones    no ulcers     no tumors    no urethral polyps    no trabeculation  - Ureteral orifices were normal in position and appearance.  Post-Procedure: - Patient tolerated the procedure well  Assessment/ Plan: Followup 6 months for cystoscopy   No follow-ups on file.  Wilkie Aye, MD

## 2023-09-10 NOTE — Progress Notes (Addendum)
 Urine sent for FISHcytology- Tracking [phone]

## 2023-09-11 ENCOUNTER — Telehealth: Payer: Self-pay | Admitting: Urology

## 2023-09-11 MED ORDER — FLUCONAZOLE 150 MG PO TABS: 150.0000 mg | ORAL_TABLET | ORAL | 0 refills | Status: DC

## 2023-09-11 MED ORDER — FLUCONAZOLE 150 MG PO TABS: 150.0000 mg | ORAL_TABLET | Freq: Every day | ORAL | 0 refills | Status: AC

## 2023-09-11 NOTE — Telephone Encounter (Signed)
 Patient saw urine results on mychart are abnormal and wants to know if she will be getting an antibiotic

## 2023-09-11 NOTE — Telephone Encounter (Signed)
 Patient states she is having some itching in her vagina area since having her cystoscopy yesterday.  Dr. Ronne Binning made aware. Per MD diflucan 150 mg po x 2  sent to pharmacy.  Patient called and made aware.

## 2023-09-15 ENCOUNTER — Ambulatory Visit (HOSPITAL_COMMUNITY): Admitting: Anesthesiology

## 2023-09-15 ENCOUNTER — Ambulatory Visit (HOSPITAL_COMMUNITY)
Admission: RE | Admit: 2023-09-15 | Discharge: 2023-09-15 | Disposition: A | Discharge status: Home / Self Care | Payer: Medicare PPO | Source: Ambulatory Visit | Attending: Internal Medicine | Admitting: Internal Medicine

## 2023-09-15 ENCOUNTER — Encounter (HOSPITAL_COMMUNITY)
Admission: RE | Disposition: A | Discharge status: Home / Self Care | Payer: Self-pay | Source: Ambulatory Visit | Attending: Internal Medicine

## 2023-09-15 ENCOUNTER — Encounter (HOSPITAL_COMMUNITY): Payer: Self-pay | Admitting: Internal Medicine

## 2023-09-15 ENCOUNTER — Other Ambulatory Visit: Payer: Self-pay

## 2023-09-15 DIAGNOSIS — I10 Essential (primary) hypertension: Secondary | ICD-10-CM | POA: Diagnosis not present

## 2023-09-15 DIAGNOSIS — K6289 Other specified diseases of anus and rectum: Secondary | ICD-10-CM

## 2023-09-15 DIAGNOSIS — K219 Gastro-esophageal reflux disease without esophagitis: Secondary | ICD-10-CM | POA: Insufficient documentation

## 2023-09-15 DIAGNOSIS — D122 Benign neoplasm of ascending colon: Secondary | ICD-10-CM

## 2023-09-15 DIAGNOSIS — K635 Polyp of colon: Secondary | ICD-10-CM

## 2023-09-15 DIAGNOSIS — K621 Rectal polyp: Secondary | ICD-10-CM | POA: Diagnosis not present

## 2023-09-15 DIAGNOSIS — Z1211 Encounter for screening for malignant neoplasm of colon: Secondary | ICD-10-CM | POA: Insufficient documentation

## 2023-09-15 DIAGNOSIS — D124 Benign neoplasm of descending colon: Secondary | ICD-10-CM | POA: Diagnosis not present

## 2023-09-15 DIAGNOSIS — K640 First degree hemorrhoids: Secondary | ICD-10-CM

## 2023-09-15 DIAGNOSIS — Z139 Encounter for screening, unspecified: Secondary | ICD-10-CM | POA: Diagnosis not present

## 2023-09-15 HISTORY — PX: COLONOSCOPY WITH PROPOFOL: SHX5780

## 2023-09-15 HISTORY — PX: POLYPECTOMY: SHX5525

## 2023-09-15 SURGERY — COLONOSCOPY WITH PROPOFOL
Anesthesia: General

## 2023-09-15 MED ORDER — PROPOFOL 10 MG/ML IV BOLUS
INTRAVENOUS | Status: DC | PRN
  Administered 2023-09-15: 80 mg via INTRAVENOUS

## 2023-09-15 MED ORDER — LACTATED RINGERS IV SOLN: INTRAVENOUS | Status: DC

## 2023-09-15 MED ORDER — LIDOCAINE HCL (CARDIAC) PF 100 MG/5ML IV SOSY
PREFILLED_SYRINGE | INTRAVENOUS | Status: DC | PRN
  Administered 2023-09-15: 60 mg via INTRATRACHEAL

## 2023-09-15 MED ORDER — PROPOFOL 500 MG/50ML IV EMUL
INTRAVENOUS | Status: DC | PRN
  Administered 2023-09-15: 150 ug/kg/min via INTRAVENOUS

## 2023-09-15 NOTE — Anesthesia Postprocedure Evaluation (Signed)
 Anesthesia Post Note  Patient: Peggy Hines  Procedure(s) Performed: COLONOSCOPY WITH PROPOFOL POLYPECTOMY  Patient location during evaluation: Phase II Anesthesia Type: General Level of consciousness: awake Pain management: pain level controlled Vital Signs Assessment: post-procedure vital signs reviewed and stable Respiratory status: spontaneous breathing and respiratory function stable Cardiovascular status: blood pressure returned to baseline and stable Postop Assessment: no headache and no apparent nausea or vomiting Anesthetic complications: no Comments: Late entry   No notable events documented.   Last Vitals:  Vitals:   09/15/23 1028 09/15/23 1030  BP: (!) 101/47 (!) 106/56  Pulse: 94 91  Resp: 15 20  Temp: 36.4 C   SpO2: 95% 96%    Last Pain:  Vitals:   09/15/23 1030  TempSrc:   PainSc: 0-No pain                 Windell Norfolk

## 2023-09-15 NOTE — Op Note (Signed)
 [facility] Patient Name: Peggy Hines Procedure Date: 09/15/2023 9:30 AM MRN: [id number] Date of Birth: February 20, 1951 Attending MD: Gennette Pac , MD, [id number] CSN: [id number] Age: 73 Admit Type: Outpatient Procedure:                Colonoscopy Indications:              Screening for colorectal malignant neoplasm Providers:                Gennette Pac, MD, Angelica Ran, Elinor Parkinson Referring MD:              Medicines:                 Complications:            No immediate complications. Estimated Blood Loss:     Estimated blood loss was minimal. Procedure:                Pre-Anesthesia Assessment:                           - Prior to the procedure, a History and Physical                            was performed, and patient medications and                            allergies were reviewed. The patient's tolerance of                            previous anesthesia was also reviewed. The risks                            and benefits of the procedure and the sedation                            options and risks were discussed with the patient.                            All questions were answered, and informed consent                            was obtained. Prior Anticoagulants: The patient has                            taken no anticoagulant or antiplatelet agents. ASA                            Grade Assessment: II - A patient with mild systemic                            disease. After reviewing the risks and benefits,  the patient was deemed in satisfactory condition to                            undergo the procedure.                           After obtaining informed consent, the colonoscope                            was passed under direct vision. Throughout the                            procedure, the patient's blood pressure, pulse, and                            oxygen saturations were  monitored continuously. The                            [id number]) scope was introduced through the                            anus and advanced to the the cecum, identified by                            appendiceal orifice and ileocecal valve. The                            colonoscopy was performed without difficulty. The                            patient tolerated the procedure well. The quality                            of the bowel preparation was adequate. The                            ileocecal valve, appendiceal orifice, and rectum                            were photographed. Scope In: 10:09:14 AM Scope Out: 10:23:07 AM Scope Withdrawal Time: 0 hours 10 minutes 22 seconds  Total Procedure Duration: 0 hours 13 minutes 53 seconds  Findings:      The perianal and digital rectal examinations were normal. [Pertinent       Negatives].      Two semi-pedunculated polyps were found in the recto-sigmoid colon and       ascending colon. The polyps were 4 to 5 mm in size. These polyps were       removed with a cold snare. Resection and retrieval were complete.      Non-bleeding internal hemorrhoids were found during retroflexion. The       hemorrhoids were moderate, medium-sized and Grade I (internal       hemorrhoids that do not prolapse). Impression:               - Two 4 to 5 mm polyps at the  recto-sigmoid colon                            and in the ascending colon, removed with a cold                            snare. Resected and retrieved.                           - Non-bleeding internal hemorrhoids and anal                            papilla.. Moderate Sedation:      Moderate (conscious) sedation was personally administered by an       anesthesia professional. The following parameters were monitored: oxygen       saturation, heart rate, blood pressure, respiratory rate, EKG, adequacy       of pulmonary ventilation, and response to care. Recommendation:           -  Repeat colonoscopy date to be determined after                            pending pathology results are reviewed for                            surveillance.                           - Return to GI office (date not yet determined). Procedure Code(s):        --- Professional ---                           [phone], Colonoscopy, flexible; with removal of                            tumor(s), polyp(s), or other lesion(s) by snare                            technique Diagnosis Code(s):        --- Professional ---                           Z12.11, Encounter for screening for malignant                            neoplasm of colon                           D12.7, Benign neoplasm of rectosigmoid junction                           D12.2, Benign neoplasm of ascending colon                           K64.0, First degree hemorrhoids CPT copyright 2022 American [employer]. All rights reserved. The codes documented in this report are preliminary and upon coder review may  be  revised to meet current compliance requirements. Gerrit Friends. Karlita Lichtman, MD Gennette Pac, MD 09/15/2023 10:34:38 AM This report has been signed electronically. Number of Addenda: 0

## 2023-09-15 NOTE — Transfer of Care (Addendum)
 Immediate Anesthesia Transfer of Care Note  Patient: Peggy Hines  Procedure(s) Performed: COLONOSCOPY WITH PROPOFOL POLYPECTOMY  Patient Location: Endoscopy Unit  Anesthesia Type:General  Level of Consciousness: drowsy and patient cooperative  Airway & Oxygen Therapy: Patient Spontanous Breathing and Patient connected to nasal cannula oxygen  Post-op Assessment: Report given to RN and Post -op Vital signs reviewed and stable  Post vital signs: Reviewed and stable  Last Vitals:  Vitals Value Taken Time  BP 101/47 09/15/23   1028  Temp 36.4 09/15/23   1028  Pulse 94 09/15/23   1028  Resp 15 09/15/23   1028  SpO2 95% 09/15/23   1028    Last Pain:  Vitals:   09/15/23 1004  TempSrc:   PainSc: 0-No pain      Patients Stated Pain Goal: 5 (09/15/23 0902)  Complications: No notable events documented.

## 2023-09-15 NOTE — H&P (Signed)
 @LOGO @   Primary Care Physician:  Assunta Found, MD Primary Gastroenterologist:  Dr. Jena Gauss  Pre-Procedure History & Physical: HPI:  Peggy Hines is a 73 y.o. female is here for a screening colonoscopy.   Patient recalls colonoscopy many years ago when [facility] no details available beyond that.  No bowel symptoms.  No family history of colon cancer.  Past Medical History:  Diagnosis Date   Acid reflux    Hyperlipidemia    Mucous membrane pemphigoid    Mucous membrane pemphigoid    PONV (postoperative nausea and vomiting)    pt requests zofran before surgery.    Past Surgical History:  Procedure Laterality Date   ABDOMINAL HYSTERECTOMY     APPENDECTOMY     CHOLECYSTECTOMY     CYSTOSCOPY N/A 12/29/2019   Procedure: CYSTOSCOPY;  Surgeon: Malen Gauze, MD;  Location: [facility];  Service: Urology;  Laterality: N/A;   CYSTOSCOPY W/ RETROGRADES Bilateral 11/22/2019   Procedure: CYSTOSCOPY WITH BILATERAL RETROGRADE PYELOGRAM;  Surgeon: Malen Gauze, MD;  Location: [facility];  Service: Urology;  Laterality: Bilateral;   SPINAL FUSION  2017   C 5/6 C 6/7   TRANSURETHRAL RESECTION OF BLADDER TUMOR Bilateral 11/22/2019   Procedure: TRANSURETHRAL RESECTION OF BLADDER TUMOR (TURBT);  Surgeon: Malen Gauze, MD;  Location: [facility];  Service: Urology;  Laterality: Bilateral;   TRANSURETHRAL RESECTION OF BLADDER TUMOR N/A 12/29/2019   Procedure: TRANSURETHRAL RESECTION OF BLADDER TUMOR (TURBT);  Surgeon: Malen Gauze, MD;  Location: [facility];  Service: Urology;  Laterality: N/A;    Prior to Admission medications   Medication Sig Start Date End Date Taking? Authorizing Provider  aspirin EC 81 MG tablet Take 81 mg by mouth every evening.    Yes [provider]  atorvastatin (LIPITOR) 20 MG tablet Take 20 mg by mouth every evening.    Yes [provider]  Cholecalciferol (VITAMIN D3) 50 MCG (2000 UT) TABS Take 2,000 Units by mouth every evening.   Yes  [provider]  famotidine (PEPCID) 20 MG tablet Take 20 mg by mouth at bedtime.   Yes [provider]  Multiple Vitamins-Minerals (PRESERVISION AREDS 2 PO) Take 1 tablet by mouth at bedtime.   Yes [provider]  Omega-3 Fatty Acids (FISH OIL) 1000 MG CAPS Take 2,000 mg by mouth daily.   Yes [provider]  Na Sulfate-K Sulfate-Mg Sulfate concentrate 17.5-3.13-1.6 GM/177ML SOLN Take 1 kit by mouth as directed. 08/15/23   Justin Buechner, Gerrit Friends, MD  riTUXimab (RITUXAN IV) Inject 1 Dose into the vein every 6 (six) months.    [provider]    Allergies as of 08/15/2023 - Review Complete 08/07/2023  Allergen Reaction Noted   Tamiflu  [oseltamivir] Nausea And Vomiting 11/07/2016    Family History  Problem Relation Age of Onset   Breast cancer Sister 17   Stroke Father    Stroke Mother     Social History   Socioeconomic History   Marital status: Married    Spouse name: Not on file   Number of children: Not on file   Years of education: Not on file   Highest education level: Not on file  Occupational History   Not on file  Tobacco Use   Smoking status: Never   Smokeless tobacco: Never  Vaping Use   Vaping status: Never Used  Substance and Sexual Activity   Alcohol use: Never    Alcohol/week: 0.0 standard drinks of alcohol   Drug  use: Never   Sexual activity: Yes  Other Topics Concern   Not on file  Social History Narrative   Not on file   Social Drivers of Health   Financial Resource Strain: Not on file  Food Insecurity: Low Risk  (10/22/2022)   Received from [facility]   Hunger Vital Sign    Worried About Running Out of Food in the Last Year: Never true    Ran Out of Food in the Last Year: Never true  Transportation Needs: Not on file (10/22/2022)  Physical Activity: Not on file  Stress: Not on file  Social Connections: Not on file  Intimate Partner Violence: Not on file    Review of Systems: See HPI, otherwise negative  ROS  Physical Exam: BP (!) 186/85   Pulse (!) 104   Temp 97.9 F (36.6 C) (Oral)   Resp 16   Ht 5\' 8"  (1.727 m)   Wt 88.9 kg   SpO2 99%   BMI 29.80 kg/m  General:   Alert,  Well-developed, well-nourished, pleasant and cooperative in NAD Lungs:  Clear throughout to auscultation.   No wheezes, crackles, or rhonchi. No acute distress. Heart:  Regular rate and rhythm; no murmurs, clicks, rubs,  or gallops. Abdomen:  Soft, nontender and nondistended. No masses, hepatosplenomegaly or hernias noted. Normal bowel sounds, without guarding, and without rebound.   Impression/Plan: Peggy Hines is now here to undergo a screening colonoscopy. average risk screening examination.  Risks, benefits, limitations, imponderables and alternatives regarding colonoscopy have been reviewed with the patient. Questions have been answered. All parties agreeable.     Notice:  This dictation was prepared with Dragon dictation along with smaller phrase technology. Any transcriptional errors that result from this process are unintentional and may not be corrected upon review.

## 2023-09-15 NOTE — Anesthesia Preprocedure Evaluation (Signed)
Anesthesia Evaluation  Patient identified by MRN, date of birth, ID band Patient awake    Reviewed: Allergy & Precautions, H&P , NPO status , Patient's Chart, lab work & pertinent test results, reviewed documented beta blocker date and time   History of Anesthesia Complications (+) PONV and history of anesthetic complications  Airway Mallampati: II  TM Distance: >3 FB Neck ROM: full    Dental no notable dental hx.    Pulmonary neg pulmonary ROS   Pulmonary exam normal breath sounds clear to auscultation       Cardiovascular Exercise Tolerance: Good hypertension, negative cardio ROS  Rhythm:regular Rate:Normal     Neuro/Psych negative neurological ROS  negative psych ROS   GI/Hepatic negative GI ROS, Neg liver ROS,GERD  ,,  Endo/Other  negative endocrine ROS    Renal/GU negative Renal ROS  negative genitourinary   Musculoskeletal   Abdominal   Peds  Hematology negative hematology ROS (+)   Anesthesia Other Findings   Reproductive/Obstetrics negative OB ROS                             Anesthesia Physical Anesthesia Plan  ASA: 2  Anesthesia Plan: General   Post-op Pain Management:    Induction:   PONV Risk Score and Plan: Propofol infusion  Airway Management Planned:   Additional Equipment:   Intra-op Plan:   Post-operative Plan:   Informed Consent: I have reviewed the patients History and Physical, chart, labs and discussed the procedure including the risks, benefits and alternatives for the proposed anesthesia with the patient or authorized representative who has indicated his/her understanding and acceptance.     Dental Advisory Given  Plan Discussed with: CRNA  Anesthesia Plan Comments:        Anesthesia Quick Evaluation

## 2023-09-15 NOTE — Discharge Instructions (Signed)
  Colonoscopy Discharge Instructions  Read the instructions outlined below and refer to this sheet in the next few weeks. These discharge instructions provide you with general information on caring for yourself after you leave the hospital. Your doctor may also give you specific instructions. While your treatment has been planned according to the most current medical practices available, unavoidable complications occasionally occur. If you have any problems or questions after discharge, call Dr. Jena Gauss at [phone]. ACTIVITY You may resume your regular activity, but move at a slower pace for the next 24 hours.  Take frequent rest periods for the next 24 hours.  Walking will help get rid of the air and reduce the bloated feeling in your belly (abdomen).  No driving for 24 hours (because of the medicine (anesthesia) used during the test).   Do not sign any important legal documents or operate any machinery for 24 hours (because of the anesthesia used during the test).  NUTRITION Drink plenty of fluids.  You may resume your normal diet as instructed by your doctor.  Begin with a light meal and progress to your normal diet. Heavy or fried foods are harder to digest and may make you feel sick to your stomach (nauseated).  Avoid alcoholic beverages for 24 hours or as instructed.  MEDICATIONS You may resume your normal medications unless your doctor tells you otherwise.  WHAT YOU CAN EXPECT TODAY Some feelings of bloating in the abdomen.  Passage of more gas than usual.  Spotting of blood in your stool or on the toilet paper.  IF YOU HAD POLYPS REMOVED DURING THE COLONOSCOPY: No aspirin products for 7 days or as instructed.  No alcohol for 7 days or as instructed.  Eat a soft diet for the next 24 hours.  FINDING OUT THE RESULTS OF YOUR TEST Not all test results are available during your visit. If your test results are not back during the visit, make an appointment with your caregiver to find out the  results. Do not assume everything is normal if you have not heard from your caregiver or the medical facility. It is important for you to follow up on all of your test results.  SEEK IMMEDIATE MEDICAL ATTENTION IF: You have more than a spotting of blood in your stool.  Your belly is swollen (abdominal distention).  You are nauseated or vomiting.  You have a temperature over 101.  You have abdominal pain or discomfort that is severe or gets worse throughout the day.     2 small polyps found and removed today  Further recommendations to follow pending review of pathology report

## 2023-09-16 ENCOUNTER — Encounter: Payer: Self-pay | Admitting: Urology

## 2023-09-16 LAB — SURGICAL PATHOLOGY

## 2023-09-16 NOTE — Patient Instructions (Signed)

## 2023-09-17 ENCOUNTER — Encounter: Payer: Self-pay | Admitting: Internal Medicine

## 2023-09-29 DIAGNOSIS — Z79620 Long term (current) use of immunosuppressive biologic: Secondary | ICD-10-CM | POA: Diagnosis not present

## 2023-09-29 DIAGNOSIS — L121 Cicatricial pemphigoid: Secondary | ICD-10-CM | POA: Diagnosis not present

## 2023-10-03 ENCOUNTER — Encounter: Payer: Self-pay | Admitting: Urology

## 2023-10-06 DIAGNOSIS — I10 Essential (primary) hypertension: Secondary | ICD-10-CM | POA: Diagnosis not present

## 2023-10-06 DIAGNOSIS — Z6830 Body mass index (BMI) 30.0-30.9, adult: Secondary | ICD-10-CM | POA: Diagnosis not present

## 2023-10-06 DIAGNOSIS — E6609 Other obesity due to excess calories: Secondary | ICD-10-CM | POA: Diagnosis not present

## 2023-11-10 DIAGNOSIS — L121 Cicatricial pemphigoid: Secondary | ICD-10-CM | POA: Diagnosis not present

## 2023-11-11 DIAGNOSIS — L121 Cicatricial pemphigoid: Secondary | ICD-10-CM | POA: Diagnosis not present

## 2023-11-24 DIAGNOSIS — L121 Cicatricial pemphigoid: Secondary | ICD-10-CM | POA: Diagnosis not present

## 2023-11-25 DIAGNOSIS — L121 Cicatricial pemphigoid: Secondary | ICD-10-CM | POA: Diagnosis not present

## 2023-12-30 DIAGNOSIS — K118 Other diseases of salivary glands: Secondary | ICD-10-CM | POA: Diagnosis not present

## 2023-12-31 DIAGNOSIS — I10 Essential (primary) hypertension: Secondary | ICD-10-CM | POA: Diagnosis not present

## 2023-12-31 DIAGNOSIS — E6609 Other obesity due to excess calories: Secondary | ICD-10-CM | POA: Diagnosis not present

## 2023-12-31 DIAGNOSIS — Z6830 Body mass index (BMI) 30.0-30.9, adult: Secondary | ICD-10-CM | POA: Diagnosis not present

## 2024-03-15 ENCOUNTER — Ambulatory Visit: Admitting: Urology

## 2024-03-15 VITALS — BP 146/85 | HR 89

## 2024-03-15 DIAGNOSIS — C672 Malignant neoplasm of lateral wall of bladder: Secondary | ICD-10-CM

## 2024-03-15 DIAGNOSIS — C679 Malignant neoplasm of bladder, unspecified: Secondary | ICD-10-CM

## 2024-03-15 LAB — MICROSCOPIC EXAMINATION: Epithelial Cells (non renal): >10 /HPF — AB (ref 0–10)

## 2024-03-15 LAB — URINALYSIS, ROUTINE W REFLEX MICROSCOPIC
Bilirubin, UA: NEGATIVE
Glucose, UA: NEGATIVE
Ketones, UA: NEGATIVE
Nitrite, UA: NEGATIVE
Protein,UA: NEGATIVE
RBC, UA: NEGATIVE
Specific Gravity, UA: 1.02 (ref 1.005–1.030)
Urobilinogen, Ur: 0.2 mg/dL (ref 0.2–1.0)
pH, UA: 6 (ref 5.0–7.5)

## 2024-03-15 MED ORDER — CIPROFLOXACIN HCL 500 MG PO TABS
500.0000 mg | ORAL_TABLET | Freq: Once | ORAL | Status: AC
  Administered 2024-03-15: 500 mg via ORAL

## 2024-03-15 NOTE — Progress Notes (Unsigned)
   03/15/24  CC: followup bladder cancer   HPI: Peggy Hines is a 72yo here for followup for bladder cancer Blood pressure (!) 146/85, pulse 89. NED. A&Ox3.   No respiratory distress   Abd soft, NT, ND Normal external genitalia with patent urethral meatus  Cystoscopy Procedure Note  Patient identification was confirmed, informed consent was obtained, and patient was prepped using Betadine solution.  Lidocaine  jelly was administered per urethral meatus.    Procedure: - Flexible cystoscope introduced, without any difficulty.   - Thorough search of the bladder revealed:    normal urethral meatus    normal urothelium    no stones    no ulcers     no tumors    no urethral polyps    no trabeculation  - Ureteral orifices were normal in position and appearance.  Post-Procedure: - Patient tolerated the procedure well  Assessment/ Plan: Followup 1 year wfor cystoscopy   No follow-ups on file.  Belvie Clara, MD

## 2024-03-16 ENCOUNTER — Encounter: Payer: Self-pay | Admitting: Urology

## 2024-03-16 NOTE — Patient Instructions (Signed)

## 2024-04-05 ENCOUNTER — Other Ambulatory Visit: Payer: Self-pay | Admitting: Family Medicine

## 2024-04-05 DIAGNOSIS — Z1231 Encounter for screening mammogram for malignant neoplasm of breast: Secondary | ICD-10-CM

## 2024-04-29 ENCOUNTER — Other Ambulatory Visit

## 2024-04-29 ENCOUNTER — Telehealth: Payer: Self-pay

## 2024-04-29 DIAGNOSIS — R300 Dysuria: Secondary | ICD-10-CM

## 2024-04-29 DIAGNOSIS — C672 Malignant neoplasm of lateral wall of bladder: Secondary | ICD-10-CM

## 2024-04-29 LAB — URINALYSIS, ROUTINE W REFLEX MICROSCOPIC
Bilirubin, UA: NEGATIVE
Glucose, UA: NEGATIVE
Nitrite, UA: POSITIVE — AB
Specific Gravity, UA: 1.025 (ref 1.005–1.030)
Urobilinogen, Ur: 0.2 mg/dL (ref 0.2–1.0)
pH, UA: 6 (ref 5.0–7.5)

## 2024-04-29 LAB — MICROSCOPIC EXAMINATION: WBC, UA: >30 /HPF — AB (ref 0–5)

## 2024-04-29 MED ORDER — SULFAMETHOXAZOLE-TRIMETHOPRIM 800-160 MG PO TABS: 1.0000 | ORAL_TABLET | Freq: Two times a day (BID) | ORAL | 0 refills | Status: AC

## 2024-04-29 NOTE — Addendum Note (Signed)
 Addended by: MALACHY SLICE on: 04/29/2024 01:48 PM   Modules accepted: Orders

## 2024-04-29 NOTE — Telephone Encounter (Signed)
 Per Dr. Sherrilee Bactrim  DS bid x 7 days sent in. Patient called and made aware.

## 2024-04-29 NOTE — Telephone Encounter (Signed)
 Patient called complaining of a possible UTI and request to drop off urine specimen  Dysuria  Patient called with c/o dysuria x on and off for 1 week. Pain: pressure and pin needle sticking feeling    Associated Signs and Symptoms:  Fever: noTemp. Chills: no Hematuria: no Urgency: no Frequency: no Hesitancy:no Incontinence: no Nausea: no Vomiting: no  Urologic History:  Any Recent Urologic Surgeries or Procedures:cystoscopy 9/08 Recurrent UTI's:no Cystitis: no  Prostatitis:no Kidney or Bladder Stones: no Appointment w/Physician: [no Lab visit scheduled for urine drop off:yes

## 2024-04-30 ENCOUNTER — Ambulatory Visit
Admission: RE | Admit: 2024-04-30 | Discharge: 2024-04-30 | Disposition: A | Discharge status: Home / Self Care | Source: Ambulatory Visit | Attending: Family Medicine | Admitting: Family Medicine

## 2024-04-30 DIAGNOSIS — Z1231 Encounter for screening mammogram for malignant neoplasm of breast: Secondary | ICD-10-CM

## 2024-05-04 ENCOUNTER — Telehealth: Payer: Self-pay

## 2024-05-04 LAB — URINE CULTURE

## 2024-05-04 NOTE — Telephone Encounter (Signed)
 Copied from CRM #[id number]. Topic: Appointments - Scheduling Inquiry for Clinic >> May 04, 2024  2:38 PM Antony RAMAN wrote: Reason for CRM: called to establish care with dr pickard, I informed the pt he is not currently accepting new patients but she mentioned someone in office said they could work some magic to get her in with him. Please advise pt if its possible, [phone] (M)

## 2024-06-28 ENCOUNTER — Other Ambulatory Visit (HOSPITAL_COMMUNITY): Payer: Self-pay | Admitting: Otolaryngology

## 2024-06-28 DIAGNOSIS — K118 Other diseases of salivary glands: Secondary | ICD-10-CM

## 2024-07-15 ENCOUNTER — Ambulatory Visit (HOSPITAL_COMMUNITY)
Admission: RE | Admit: 2024-07-15 | Discharge: 2024-07-15 | Disposition: A | Discharge status: Home / Self Care | Source: Ambulatory Visit | Attending: Otolaryngology | Admitting: Otolaryngology

## 2024-07-15 DIAGNOSIS — K118 Other diseases of salivary glands: Secondary | ICD-10-CM

## 2024-07-15 MED ORDER — IOHEXOL 300 MG/ML  SOLN
75.0000 mL | Freq: Once | INTRAMUSCULAR | Status: AC | PRN
  Administered 2024-07-15: 75 mL via INTRAVENOUS

## 2024-07-30 ENCOUNTER — Ambulatory Visit: Admitting: Family Medicine

## 2024-07-30 ENCOUNTER — Encounter: Payer: Self-pay | Admitting: Family Medicine

## 2024-07-30 VITALS — BP 146/86 | HR 83 | Temp 97.7°F | Ht 68.0 in | Wt 199.0 lb

## 2024-07-30 DIAGNOSIS — Z8551 Personal history of malignant neoplasm of bladder: Secondary | ICD-10-CM | POA: Diagnosis not present

## 2024-07-30 DIAGNOSIS — I10 Essential (primary) hypertension: Secondary | ICD-10-CM | POA: Diagnosis not present

## 2024-07-30 DIAGNOSIS — E7800 Pure hypercholesterolemia, unspecified: Secondary | ICD-10-CM | POA: Diagnosis not present

## 2024-07-30 MED ORDER — ATORVASTATIN CALCIUM 20 MG PO TABS: 20.0000 mg | ORAL_TABLET | Freq: Every evening | ORAL | 3 refills | Status: AC

## 2024-07-30 MED ORDER — FAMOTIDINE 20 MG PO TABS: 20.0000 mg | ORAL_TABLET | Freq: Every day | ORAL | 3 refills | Status: AC

## 2024-07-30 NOTE — Progress Notes (Signed)
 "  Subjective:    Patient ID: Peggy Hines, female    DOB: 03-09-1951, 74 y.o.   MRN: [id number]  HPI Patient is a very pleasant 74 year old Caucasian female here today to establish care.  Patient states that she was diagnosed with bladder cancer in 2021.  She currently sees Dr. Sherrilee annually for cystoscopy.  She went through 2 surgical excisions and also chemotherapeutic bladder installations as treatment.  She states that she is cancer free.  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  She is currently on rituximab  therapy for this.  This is limited to blisters and lesions of her mouth.  She also has a history of hypertension and hyperlipidemia.  Her blood pressure here today is 146/86.  She could not tolerate Azor in the past.  She is also had a difficult time tolerating amlodipine due to leg swelling.  She denies any problems with hydrochlorothiazide in the past.  She also has a history of hyperlipidemia for which she takes atorvastatin.  She is due for a pneumonia vaccine.  She is also due for a shingles vaccine.  She declines both these today especially the shingles vaccine because she had a severe reaction to the Zostavax in the past. Past Medical History:  Diagnosis Date   Acid reflux    Hyperlipidemia    Mucous membrane pemphigoid (HCC)    Mucous membrane pemphigoid (HCC)    PONV (postoperative nausea and vomiting)    pt requests zofran  before surgery.   Past Surgical History:  Procedure Laterality Date   ABDOMINAL HYSTERECTOMY     APPENDECTOMY     CHOLECYSTECTOMY     COLONOSCOPY WITH PROPOFOL  N/A 09/15/2023   Procedure: COLONOSCOPY WITH PROPOFOL ;  Surgeon: Shaaron Lamar HERO, MD;  Location: [facility];  Service: Endoscopy;  Laterality: N/A;  10:30am, asa 2   CYSTOSCOPY N/A 12/29/2019   Procedure: CYSTOSCOPY;  Surgeon: Sherrilee Belvie CROME, MD;  Location: [facility];  Service: Urology;  Laterality: N/A;   CYSTOSCOPY W/ RETROGRADES Bilateral 11/22/2019   Procedure:  CYSTOSCOPY WITH BILATERAL RETROGRADE PYELOGRAM;  Surgeon: Sherrilee Belvie CROME, MD;  Location: [facility];  Service: Urology;  Laterality: Bilateral;   POLYPECTOMY  09/15/2023   Procedure: POLYPECTOMY;  Surgeon: Shaaron Lamar HERO, MD;  Location: [facility];  Service: Endoscopy;;   SPINAL FUSION  2017   C 5/6 C 6/7   TRANSURETHRAL RESECTION OF BLADDER TUMOR Bilateral 11/22/2019   Procedure: TRANSURETHRAL RESECTION OF BLADDER TUMOR (TURBT);  Surgeon: Sherrilee Belvie CROME, MD;  Location: [facility];  Service: Urology;  Laterality: Bilateral;   TRANSURETHRAL RESECTION OF BLADDER TUMOR N/A 12/29/2019   Procedure: TRANSURETHRAL RESECTION OF BLADDER TUMOR (TURBT);  Surgeon: Sherrilee Belvie CROME, MD;  Location: [facility];  Service: Urology;  Laterality: N/A;   Medications Ordered Prior to Encounter[1] Allergies[2] Social History   Socioeconomic History   Marital status: Married    Spouse name: Not on file   Number of children: Not on file   Years of education: Not on file   Highest education level: Bachelor's degree (e.g., BA, AB, BS)  Occupational History   Not on file  Tobacco Use   Smoking status: Never   Smokeless tobacco: Never  Vaping Use   Vaping status: Never Used  Substance and Sexual Activity   Alcohol use: Never    Alcohol/week: 0.0 standard drinks of alcohol   Drug use: Never   Sexual activity: Yes  Other Topics Concern   Not on file  Social  History Narrative   Not on file   Social Drivers of Health   Tobacco Use: Low Risk (07/30/2024)   Patient History    Smoking Tobacco Use: Never    Smokeless Tobacco Use: Never    Passive Exposure: Not on file  Financial Resource Strain: Low Risk (07/27/2024)   Overall Financial Resource Strain (CARDIA)    Difficulty of Paying Living Expenses: Not hard at all  Food Insecurity: No Food Insecurity (07/27/2024)   Epic    Worried About Programme Researcher, Broadcasting/film/video in the Last Year: Never true    Ran Out of Food in the Last Year: Never true  Transportation  Needs: No Transportation Needs (07/27/2024)   Epic    Lack of Transportation (Medical): No    Lack of Transportation (Non-Medical): No  Physical Activity: Inactive (07/27/2024)   Exercise Vital Sign    Days of Exercise per Week: 0 days    Minutes of Exercise per Session: Not on file  Stress: No Stress Concern Present (07/27/2024)   [employer] of Occupational Health - Occupational Stress Questionnaire    Feeling of Stress: Not at all  Social Connections: Socially Integrated (07/27/2024)   Social Connection and Isolation Panel    Frequency of Communication with Friends and Family: More than three times a week    Frequency of Social Gatherings with Friends and Family: More than three times a week    Attends Religious Services: More than 4 times per year    Active Member of [employer] or Organizations: Yes    Attends Engineer, Structural: More than 4 times per year    Marital Status: Married  Catering Manager Violence: Not on file  Depression (PHQ2-9): Not on file  Alcohol Screen: Not on file  Housing: Low Risk (07/27/2024)   Epic    Unable to Pay for Housing in the Last Year: No    Number of Times Moved in the Last Year: 0    Homeless in the Last Year: No  Utilities: Low Risk (10/22/2022)   Received from [facility]   Utilities    In the past 12 months has the electric, gas, oil, or water  company threatened to shut off services in your home? : No  Health Literacy: Not on file     Review of Systems  All other systems reviewed and are negative.      Objective:   Physical Exam Constitutional:      Appearance: Normal appearance. She is normal weight.  Neck:     Vascular: No carotid bruit.  Cardiovascular:     Rate and Rhythm: Normal rate and regular rhythm.     Pulses: Normal pulses.     Heart sounds: Normal heart sounds. No murmur heard.    No friction rub. No gallop.  Pulmonary:     Effort: Pulmonary effort is normal. No respiratory distress.     Breath sounds:  Normal breath sounds. No wheezing, rhonchi or rales.  Abdominal:     General: Abdomen is flat. Bowel sounds are normal.     Palpations: Abdomen is soft.  Musculoskeletal:     Right lower leg: No edema.     Left lower leg: No edema.  Lymphadenopathy:     Cervical: No cervical adenopathy.  Neurological:     Mental Status: She is alert.           Assessment & Plan:  Pure hypercholesterolemia - Plan: CBC with Differential/Platelet, Comprehensive metabolic panel with GFR, Lipid panel  Benign essential HTN  History of bladder cancer I will defer management of her bladder cancer to her urologist.  She is 4 years in remission.  Her blood pressure is elevated today.  Recommended starting hydrochlorothiazide 25 mg a day and rechecking in 2 weeks.  Check CBC CMP and fasting lipid panel.  I would like to see her LDL cholesterol less than 899.  Patient states that her colonoscopy is up-to-date.  She had a mammogram in 2025 that was normal.  She does not require Pap smear due to her age.  She declines the pneumonia vaccine and the shingles vaccine.  I did     [1]  Current Outpatient Medications on File Prior to Visit  Medication Sig Dispense Refill   aspirin EC 81 MG tablet Take 81 mg by mouth every evening.      Cholecalciferol (VITAMIN D3) 50 MCG (2000 UT) TABS Take 2,000 Units by mouth every evening.     Multiple Vitamins-Minerals (PRESERVISION AREDS 2 PO) Take 1 tablet by mouth at bedtime.     Na Sulfate-K Sulfate-Mg Sulfate concentrate 17.5-3.13-1.6 GM/177ML SOLN Take 1 kit by mouth as directed. 354 mL 0   Omega-3 Fatty Acids (FISH OIL) 1000 MG CAPS Take 2,000 mg by mouth daily.     riTUXimab  (RITUXAN  IV) Inject 1 Dose into the vein every 6 (six) months.     sulfamethoxazole -trimethoprim  (BACTRIM  DS) 800-160 MG tablet Take 1 tablet by mouth 2 (two) times daily. (Patient not taking: Reported on 07/30/2024) 14 tablet 0   No current facility-administered medications on file prior to visit.   [2]  Allergies Allergen Reactions   Tamiflu   [Oseltamivir ] Nausea And Vomiting   "

## 2024-08-03 ENCOUNTER — Ambulatory Visit: Payer: Self-pay | Admitting: Family Medicine

## 2024-08-04 NOTE — Progress Notes (Signed)
 Sent to [facility] to add on A1c.

## 2024-08-06 LAB — COMPREHENSIVE METABOLIC PANEL WITH GFR
AG Ratio: 2 (calc) (ref 1.0–2.5)
ALT: 25 U/L (ref 6–29)
AST: 21 U/L (ref 10–35)
Albumin: 4.3 g/dL (ref 3.6–5.1)
Alkaline phosphatase (APISO): 121 U/L (ref 37–153)
BUN: 18 mg/dL (ref 7–25)
CO2: 31 mmol/L (ref 20–32)
Calcium: 9.5 mg/dL (ref 8.6–10.4)
Chloride: 102 mmol/L (ref 98–110)
Creat: 0.88 mg/dL (ref 0.60–1.00)
Globulin: 2.2 g/dL (ref 1.9–3.7)
Glucose, Bld: 163 mg/dL — ABNORMAL HIGH (ref 65–99)
Potassium: 4.4 mmol/L (ref 3.5–5.3)
Sodium: 139 mmol/L (ref 135–146)
Total Bilirubin: 0.6 mg/dL (ref 0.2–1.2)
Total Protein: 6.5 g/dL (ref 6.1–8.1)
eGFR: 69 mL/min/{1.73_m2}

## 2024-08-06 LAB — CBC WITH DIFFERENTIAL/PLATELET
Absolute Lymphocytes: 1895 {cells}/uL (ref 850–3900)
Absolute Monocytes: 782 {cells}/uL (ref 200–950)
Basophils Absolute: 64 {cells}/uL (ref 0–200)
Basophils Relative: 0.7 %
Eosinophils Absolute: 239 {cells}/uL (ref 15–500)
Eosinophils Relative: 2.6 %
HCT: 49.1 % — ABNORMAL HIGH (ref 35.9–46.0)
Hemoglobin: 16.4 g/dL — ABNORMAL HIGH (ref 11.7–15.5)
MCH: 30.5 pg (ref 27.0–33.0)
MCHC: 33.4 g/dL (ref 31.6–35.4)
MCV: 91.3 fL (ref 81.4–101.7)
MPV: 10.7 fL (ref 7.5–12.5)
Monocytes Relative: 8.5 %
Neutro Abs: 6219 {cells}/uL (ref 1500–7800)
Neutrophils Relative %: 67.6 %
Platelets: 249 10*3/uL (ref 140–400)
RBC: 5.38 Million/uL — ABNORMAL HIGH (ref 3.80–5.10)
RDW: 12.2 % (ref 11.0–15.0)
Total Lymphocyte: 20.6 %
WBC: 9.2 10*3/uL (ref 3.8–10.8)

## 2024-08-06 LAB — LIPID PANEL
Cholesterol: 145 mg/dL
HDL: 47 mg/dL — ABNORMAL LOW
LDL Cholesterol (Calc): 72 mg/dL
Non-HDL Cholesterol (Calc): 98 mg/dL
Total CHOL/HDL Ratio: 3.1 (calc)
Triglycerides: 184 mg/dL — ABNORMAL HIGH

## 2024-08-06 LAB — TEST AUTHORIZATION

## 2024-08-06 LAB — HEMOGLOBIN A1C
Hgb A1c MFr Bld: 7.2 % — ABNORMAL HIGH
Mean Plasma Glucose: 160 mg/dL
eAG (mmol/L): 8.9 mmol/L

## 2024-12-31 ENCOUNTER — Ambulatory Visit: Admitting: Family Medicine

## 2025-03-21 ENCOUNTER — Other Ambulatory Visit: Admitting: Urology
